# Patient Record
Sex: Female | Born: 1940 | Race: White | Hispanic: No | Marital: Single | Smoking: Former smoker
Health system: Southern US, Community
[De-identification: ages and names within clinical notes are randomized; demographics above are authoritative.]

## PROBLEM LIST (undated history)

## (undated) DIAGNOSIS — M1990 Unspecified osteoarthritis, unspecified site: Secondary | ICD-10-CM

## (undated) DIAGNOSIS — E039 Hypothyroidism, unspecified: Secondary | ICD-10-CM

## (undated) DIAGNOSIS — G40909 Epilepsy, unspecified, not intractable, without status epilepticus: Secondary | ICD-10-CM

## (undated) DIAGNOSIS — E119 Type 2 diabetes mellitus without complications: Secondary | ICD-10-CM

## (undated) DIAGNOSIS — F0390 Unspecified dementia without behavioral disturbance: Secondary | ICD-10-CM

## (undated) DIAGNOSIS — I499 Cardiac arrhythmia, unspecified: Secondary | ICD-10-CM

## (undated) DIAGNOSIS — IMO0001 Reserved for inherently not codable concepts without codable children: Secondary | ICD-10-CM

## (undated) DIAGNOSIS — Z972 Presence of dental prosthetic device (complete) (partial): Secondary | ICD-10-CM

## (undated) DIAGNOSIS — S4990XA Unspecified injury of shoulder and upper arm, unspecified arm, initial encounter: Secondary | ICD-10-CM

## (undated) DIAGNOSIS — K08109 Complete loss of teeth, unspecified cause, unspecified class: Secondary | ICD-10-CM

## (undated) DIAGNOSIS — J45909 Unspecified asthma, uncomplicated: Secondary | ICD-10-CM

## (undated) DIAGNOSIS — I10 Essential (primary) hypertension: Secondary | ICD-10-CM

## (undated) DIAGNOSIS — K219 Gastro-esophageal reflux disease without esophagitis: Secondary | ICD-10-CM

## (undated) DIAGNOSIS — G709 Myoneural disorder, unspecified: Secondary | ICD-10-CM

## (undated) DIAGNOSIS — J449 Chronic obstructive pulmonary disease, unspecified: Secondary | ICD-10-CM

## (undated) HISTORY — PX: APPENDECTOMY: SHX54

## (undated) HISTORY — PX: CHOLECYSTECTOMY: SHX55

## (undated) HISTORY — PX: ABDOMINAL HYSTERECTOMY: SHX81

---

## 2011-03-16 ENCOUNTER — Ambulatory Visit: Payer: Self-pay | Admitting: Unknown Physician Specialty

## 2011-03-20 LAB — PATHOLOGY REPORT

## 2014-08-27 LAB — COMPREHENSIVE METABOLIC PANEL
ALK PHOS: 119 U/L — AB
ANION GAP: 5 — AB (ref 7–16)
Albumin: 3.2 g/dL — ABNORMAL LOW (ref 3.4–5.0)
BUN: 6 mg/dL — ABNORMAL LOW (ref 7–18)
Bilirubin,Total: 0.3 mg/dL (ref 0.2–1.0)
CREATININE: 0.65 mg/dL (ref 0.60–1.30)
Calcium, Total: 8.6 mg/dL (ref 8.5–10.1)
Chloride: 98 mmol/L (ref 98–107)
Co2: 32 mmol/L (ref 21–32)
EGFR (Non-African Amer.): >60
Glucose: 89 mg/dL (ref 65–99)
OSMOLALITY: 267 (ref 275–301)
POTASSIUM: 4.3 mmol/L (ref 3.5–5.1)
SGOT(AST): 39 U/L — ABNORMAL HIGH (ref 15–37)
SGPT (ALT): 44 U/L
SODIUM: 135 mmol/L — AB (ref 136–145)
Total Protein: 7.2 g/dL (ref 6.4–8.2)

## 2014-08-27 LAB — CBC
HCT: 37.3 % (ref 35.0–47.0)
HGB: 12.2 g/dL (ref 12.0–16.0)
MCH: 31.2 pg (ref 26.0–34.0)
MCHC: 32.8 g/dL (ref 32.0–36.0)
MCV: 95 fL (ref 80–100)
Platelet: 288 10*3/uL (ref 150–440)
RBC: 3.92 10*6/uL (ref 3.80–5.20)
RDW: 15.8 % — ABNORMAL HIGH (ref 11.5–14.5)
WBC: 6.2 10*3/uL (ref 3.6–11.0)

## 2014-08-27 LAB — TROPONIN I: Troponin-I: <0.02 ng/mL

## 2014-08-28 ENCOUNTER — Observation Stay: Payer: Self-pay | Admitting: Internal Medicine

## 2014-08-28 DIAGNOSIS — I361 Nonrheumatic tricuspid (valve) insufficiency: Secondary | ICD-10-CM

## 2014-08-28 LAB — DIGOXIN LEVEL: Digoxin: 1.12 ng/mL

## 2014-08-28 LAB — HEMOGLOBIN A1C: Hemoglobin A1C: 10.1 % — ABNORMAL HIGH (ref 4.2–6.3)

## 2014-08-28 LAB — CK-MB
CK-MB: 1.4 ng/mL (ref 0.5–3.6)
CK-MB: 1.3 ng/mL (ref 0.5–3.6)
CK-MB: 1.4 ng/mL (ref 0.5–3.6)

## 2014-08-28 LAB — TROPONIN I: TROPONIN-I: 0.03 ng/mL

## 2014-08-29 LAB — CBC WITH DIFFERENTIAL/PLATELET
Basophil #: 0 10*3/uL (ref 0.0–0.1)
Basophil %: 0.2 %
EOS ABS: 0 10*3/uL (ref 0.0–0.7)
Eosinophil %: 0 %
HCT: 41 % (ref 35.0–47.0)
HGB: 13 g/dL (ref 12.0–16.0)
LYMPHS PCT: 10.6 %
Lymphocyte #: 0.6 10*3/uL — ABNORMAL LOW (ref 1.0–3.6)
MCH: 30.3 pg (ref 26.0–34.0)
MCHC: 31.7 g/dL — AB (ref 32.0–36.0)
MCV: 96 fL (ref 80–100)
MONO ABS: 0.1 x10 3/mm — AB (ref 0.2–0.9)
Monocyte %: 2.1 %
NEUTROS PCT: 87.1 %
Neutrophil #: 5.3 10*3/uL (ref 1.4–6.5)
Platelet: 299 10*3/uL (ref 150–440)
RBC: 4.28 10*6/uL (ref 3.80–5.20)
RDW: 16.1 % — ABNORMAL HIGH (ref 11.5–14.5)
WBC: 6 10*3/uL (ref 3.6–11.0)

## 2014-08-29 LAB — BASIC METABOLIC PANEL
Anion Gap: 8 (ref 7–16)
BUN: 8 mg/dL (ref 7–18)
Calcium, Total: 9 mg/dL (ref 8.5–10.1)
Chloride: 95 mmol/L — ABNORMAL LOW (ref 98–107)
Co2: 30 mmol/L (ref 21–32)
Creatinine: 0.64 mg/dL (ref 0.60–1.30)
EGFR (African American): >60
Glucose: 271 mg/dL — ABNORMAL HIGH (ref 65–99)
OSMOLALITY: 274 (ref 275–301)
Potassium: 4.6 mmol/L (ref 3.5–5.1)
Sodium: 133 mmol/L — ABNORMAL LOW (ref 136–145)

## 2014-09-03 ENCOUNTER — Ambulatory Visit: Payer: Self-pay | Admitting: Hospice and Palliative Medicine

## 2014-09-09 ENCOUNTER — Inpatient Hospital Stay: Payer: Self-pay | Admitting: Internal Medicine

## 2014-09-09 LAB — COMPREHENSIVE METABOLIC PANEL
ANION GAP: 7 (ref 7–16)
AST: 112 U/L — AB (ref 15–37)
Albumin: 3.3 g/dL — ABNORMAL LOW (ref 3.4–5.0)
Alkaline Phosphatase: 140 U/L — ABNORMAL HIGH
BUN: 10 mg/dL (ref 7–18)
Bilirubin,Total: 0.5 mg/dL (ref 0.2–1.0)
CALCIUM: 8.2 mg/dL — AB (ref 8.5–10.1)
CHLORIDE: 91 mmol/L — AB (ref 98–107)
Co2: 27 mmol/L (ref 21–32)
Creatinine: 0.73 mg/dL (ref 0.60–1.30)
EGFR (Non-African Amer.): >60
GLUCOSE: 199 mg/dL — AB (ref 65–99)
OSMOLALITY: 256 (ref 275–301)
POTASSIUM: 5 mmol/L (ref 3.5–5.1)
SGPT (ALT): 93 U/L — ABNORMAL HIGH
SODIUM: 125 mmol/L — AB (ref 136–145)
TOTAL PROTEIN: 8.1 g/dL (ref 6.4–8.2)

## 2014-09-09 LAB — BASIC METABOLIC PANEL
Anion Gap: 6 — ABNORMAL LOW (ref 7–16)
Anion Gap: 8 (ref 7–16)
BUN: 9 mg/dL (ref 7–18)
BUN: 9 mg/dL (ref 7–18)
CALCIUM: 8.3 mg/dL — AB (ref 8.5–10.1)
CALCIUM: 8.3 mg/dL — AB (ref 8.5–10.1)
CREATININE: 0.68 mg/dL (ref 0.60–1.30)
Chloride: 92 mmol/L — ABNORMAL LOW (ref 98–107)
Chloride: 94 mmol/L — ABNORMAL LOW (ref 98–107)
Co2: 33 mmol/L — ABNORMAL HIGH (ref 21–32)
Co2: 30 mmol/L (ref 21–32)
Creatinine: 0.72 mg/dL (ref 0.60–1.30)
GLUCOSE: 200 mg/dL — AB (ref 65–99)
Glucose: 181 mg/dL — ABNORMAL HIGH (ref 65–99)
OSMOLALITY: 266 (ref 275–301)
OSMOLALITY: 269 (ref 275–301)
POTASSIUM: 4.3 mmol/L (ref 3.5–5.1)
Potassium: 4.3 mmol/L (ref 3.5–5.1)
SODIUM: 131 mmol/L — AB (ref 136–145)
Sodium: 132 mmol/L — ABNORMAL LOW (ref 136–145)

## 2014-09-09 LAB — TROPONIN I
TROPONIN-I: 0.06 ng/mL — AB
TROPONIN-I: 0.07 ng/mL — AB
Troponin-I: 0.07 ng/mL — ABNORMAL HIGH

## 2014-09-09 LAB — CBC
HCT: 42.7 % (ref 35.0–47.0)
HGB: 13.6 g/dL (ref 12.0–16.0)
MCH: 30.6 pg (ref 26.0–34.0)
MCHC: 31.9 g/dL — ABNORMAL LOW (ref 32.0–36.0)
MCV: 96 fL (ref 80–100)
Platelet: 249 10*3/uL (ref 150–440)
RBC: 4.45 10*6/uL (ref 3.80–5.20)
RDW: 15.9 % — ABNORMAL HIGH (ref 11.5–14.5)
WBC: 8.5 10*3/uL (ref 3.6–11.0)

## 2014-09-09 LAB — CK TOTAL AND CKMB (NOT AT ARMC)
CK, TOTAL: 155 U/L (ref 26–192)
CK, Total: 136 U/L (ref 26–192)
CK-MB: 2.4 ng/mL (ref 0.5–3.6)
CK-MB: 2.4 ng/mL (ref 0.5–3.6)

## 2014-09-09 LAB — SODIUM, URINE, RANDOM: SODIUM, URINE RANDOM: 66 mmol/L (ref 20–110)

## 2014-09-09 LAB — OSMOLALITY, URINE: OSMOLALITY: 403 mosm/kg

## 2014-09-09 LAB — PRO B NATRIURETIC PEPTIDE: B-Type Natriuretic Peptide: 2322 pg/mL — ABNORMAL HIGH (ref 0–125)

## 2014-09-10 LAB — CBC WITH DIFFERENTIAL/PLATELET
BASOS ABS: 0 10*3/uL (ref 0.0–0.1)
BASOS PCT: 0.1 %
Eosinophil #: 0 10*3/uL (ref 0.0–0.7)
Eosinophil %: 0.1 %
HCT: 35.9 % (ref 35.0–47.0)
HGB: 11.6 g/dL — AB (ref 12.0–16.0)
LYMPHS PCT: 11.5 %
Lymphocyte #: 0.5 10*3/uL — ABNORMAL LOW (ref 1.0–3.6)
MCH: 31 pg (ref 26.0–34.0)
MCHC: 32.4 g/dL (ref 32.0–36.0)
MCV: 96 fL (ref 80–100)
MONOS PCT: 5.7 %
Monocyte #: 0.2 x10 3/mm (ref 0.2–0.9)
Neutrophil #: 3.6 10*3/uL (ref 1.4–6.5)
Neutrophil %: 82.6 %
Platelet: 216 10*3/uL (ref 150–440)
RBC: 3.76 10*6/uL — ABNORMAL LOW (ref 3.80–5.20)
RDW: 15.6 % — ABNORMAL HIGH (ref 11.5–14.5)
WBC: 4.4 10*3/uL (ref 3.6–11.0)

## 2014-09-10 LAB — PHENOBARBITAL LEVEL: PHENOBARBITAL: 40 ug/mL (ref 15.0–40.0)

## 2014-09-10 LAB — COMPREHENSIVE METABOLIC PANEL
ALK PHOS: 108 U/L
ALT: 68 U/L — AB
AST: 60 U/L — AB (ref 15–37)
Albumin: 2.5 g/dL — ABNORMAL LOW (ref 3.4–5.0)
Anion Gap: 9 (ref 7–16)
BUN: 11 mg/dL (ref 7–18)
Bilirubin,Total: 0.3 mg/dL (ref 0.2–1.0)
CALCIUM: 7.9 mg/dL — AB (ref 8.5–10.1)
CHLORIDE: 94 mmol/L — AB (ref 98–107)
CO2: 28 mmol/L (ref 21–32)
Creatinine: 0.84 mg/dL (ref 0.60–1.30)
EGFR (Non-African Amer.): >60
Glucose: 262 mg/dL — ABNORMAL HIGH (ref 65–99)
Osmolality: 271 (ref 275–301)
Potassium: 4.6 mmol/L (ref 3.5–5.1)
Sodium: 131 mmol/L — ABNORMAL LOW (ref 136–145)
Total Protein: 6.4 g/dL (ref 6.4–8.2)

## 2014-09-10 LAB — LIPID PANEL
Cholesterol: 116 mg/dL (ref 0–200)
HDL Cholesterol: 68 mg/dL — ABNORMAL HIGH (ref 40–60)
Ldl Cholesterol, Calc: 34 mg/dL (ref 0–100)
TRIGLYCERIDES: 70 mg/dL (ref 0–200)
VLDL Cholesterol, Calc: 14 mg/dL (ref 5–40)

## 2014-09-10 LAB — PHOSPHORUS: PHOSPHORUS: 3.2 mg/dL (ref 2.5–4.9)

## 2014-09-10 LAB — DIGOXIN LEVEL: Digoxin: 1.59 ng/mL

## 2014-09-10 LAB — MAGNESIUM: MAGNESIUM: 1.6 mg/dL — AB

## 2014-09-10 LAB — TSH: Thyroid Stimulating Horm: 1.06 u[IU]/mL

## 2014-09-10 LAB — HEMOGLOBIN A1C: Hemoglobin A1C: 9.2 % — ABNORMAL HIGH (ref 4.2–6.3)

## 2014-09-11 LAB — CBC WITH DIFFERENTIAL/PLATELET
BASOS ABS: 0 10*3/uL (ref 0.0–0.1)
Basophil %: 0.1 %
EOS ABS: 0 10*3/uL (ref 0.0–0.7)
EOS PCT: 0 %
HCT: 34.9 % — ABNORMAL LOW (ref 35.0–47.0)
HGB: 11.5 g/dL — AB (ref 12.0–16.0)
LYMPHS PCT: 11 %
Lymphocyte #: 0.7 10*3/uL — ABNORMAL LOW (ref 1.0–3.6)
MCH: 30.6 pg (ref 26.0–34.0)
MCHC: 32.8 g/dL (ref 32.0–36.0)
MCV: 93 fL (ref 80–100)
Monocyte #: 0.5 x10 3/mm (ref 0.2–0.9)
Monocyte %: 7.7 %
NEUTROS PCT: 81.2 %
Neutrophil #: 5 10*3/uL (ref 1.4–6.5)
Platelet: 250 10*3/uL (ref 150–440)
RBC: 3.75 10*6/uL — AB (ref 3.80–5.20)
RDW: 15.4 % — ABNORMAL HIGH (ref 11.5–14.5)
WBC: 6.1 10*3/uL (ref 3.6–11.0)

## 2014-09-11 LAB — MAGNESIUM: Magnesium: 1.8 mg/dL

## 2014-09-11 LAB — BASIC METABOLIC PANEL
ANION GAP: 1 — AB (ref 7–16)
BUN: 14 mg/dL (ref 7–18)
CREATININE: 0.79 mg/dL (ref 0.60–1.30)
Calcium, Total: 8.4 mg/dL — ABNORMAL LOW (ref 8.5–10.1)
Chloride: 98 mmol/L (ref 98–107)
Co2: 29 mmol/L (ref 21–32)
EGFR (African American): >60
Glucose: 337 mg/dL — ABNORMAL HIGH (ref 65–99)
Osmolality: 271 (ref 275–301)
Potassium: 4.6 mmol/L (ref 3.5–5.1)
SODIUM: 128 mmol/L — AB (ref 136–145)

## 2014-09-11 LAB — URINE CULTURE

## 2014-09-12 LAB — BASIC METABOLIC PANEL
ANION GAP: 6 — AB (ref 7–16)
BUN: 16 mg/dL (ref 7–18)
CO2: 31 mmol/L (ref 21–32)
Calcium, Total: 7.9 mg/dL — ABNORMAL LOW (ref 8.5–10.1)
Chloride: 94 mmol/L — ABNORMAL LOW (ref 98–107)
Creatinine: 0.8 mg/dL (ref 0.60–1.30)
EGFR (African American): >60
EGFR (Non-African Amer.): >60
Glucose: 304 mg/dL — ABNORMAL HIGH (ref 65–99)
OSMOLALITY: 275 (ref 275–301)
Potassium: 4.1 mmol/L (ref 3.5–5.1)
Sodium: 131 mmol/L — ABNORMAL LOW (ref 136–145)

## 2014-09-13 LAB — BASIC METABOLIC PANEL
ANION GAP: 2 — AB (ref 7–16)
BUN: 10 mg/dL (ref 7–18)
CALCIUM: 8.5 mg/dL (ref 8.5–10.1)
Chloride: 93 mmol/L — ABNORMAL LOW (ref 98–107)
Co2: 39 mmol/L — ABNORMAL HIGH (ref 21–32)
Creatinine: 0.65 mg/dL (ref 0.60–1.30)
EGFR (African American): >60
GLUCOSE: 237 mg/dL — AB (ref 65–99)
Osmolality: 275 (ref 275–301)
Potassium: 4 mmol/L (ref 3.5–5.1)
SODIUM: 134 mmol/L — AB (ref 136–145)

## 2014-09-14 LAB — CULTURE, BLOOD (SINGLE)

## 2014-09-16 DIAGNOSIS — J440 Chronic obstructive pulmonary disease with acute lower respiratory infection: Secondary | ICD-10-CM

## 2014-09-16 DIAGNOSIS — G3184 Mild cognitive impairment, so stated: Secondary | ICD-10-CM

## 2014-09-16 DIAGNOSIS — I480 Paroxysmal atrial fibrillation: Secondary | ICD-10-CM

## 2014-09-16 DIAGNOSIS — E119 Type 2 diabetes mellitus without complications: Secondary | ICD-10-CM

## 2014-09-16 DIAGNOSIS — J189 Pneumonia, unspecified organism: Secondary | ICD-10-CM

## 2014-10-04 ENCOUNTER — Ambulatory Visit: Payer: Self-pay | Admitting: Hospice and Palliative Medicine

## 2014-10-04 DIAGNOSIS — J189 Pneumonia, unspecified organism: Secondary | ICD-10-CM

## 2014-10-04 DIAGNOSIS — E162 Hypoglycemia, unspecified: Secondary | ICD-10-CM

## 2014-10-04 DIAGNOSIS — J90 Pleural effusion, not elsewhere classified: Secondary | ICD-10-CM

## 2014-10-04 DIAGNOSIS — I480 Paroxysmal atrial fibrillation: Secondary | ICD-10-CM

## 2014-10-04 DIAGNOSIS — J449 Chronic obstructive pulmonary disease, unspecified: Secondary | ICD-10-CM

## 2014-11-20 ENCOUNTER — Emergency Department: Payer: Self-pay | Admitting: Emergency Medicine

## 2014-12-25 NOTE — H&P (Signed)
PATIENT NAME:  Morgan Meyer, Morgan Meyer MR#:  [id number] DATE OF BIRTH:  Jun 30, 1941  DATE OF ADMISSION:  08/28/2014  PRIMARY CARE PHYSICIAN: Nonlocal.  REFERRING PHYSICIAN: Larae Grooms, MD  CHIEF COMPLAINT: Syncope, sluggishness.  HISTORY OF PRESENT ILLNESS: Morgan Meyer is a 74 year old female with a history of poorly controlled diabetes mellitus, hypertension, hyperlipidemia; has been having poorly controlled blood sugars and required admission during the Thanksgiving time for the blood sugars over 500 associated with a diabetic ketoacidosis. Since then, patient has been living with patient's son. Patient continues to have high blood sugars; has polyuria, polydipsia. Last hemoglobin A1c was 11.5. Today patient went to toilet, where patient had an episode of presyncope, slumped over for about 30 seconds. During that time, patient did not lose complete loss of consciousness. However, patient remained somewhat confused. Concerning this, patient's son checked the blood sugars, which were noted to be around 450. Patient's son gave Humalog of 20 units and brought the patient to the Emergency Department after discussing with her primary care physician. In the Emergency Department, patient was noted to have blood sugars of 90. Concerning about patient's confusion, there was concern about cardiac-related issues. The Emergency Department physician requested medicine consult. Patient at baseline walks around in the house without any distress, usually well conscious, oriented. Patient is currently quite somnolent, arousable. Mainly, history is obtained from the patient's son who is at bedside and who is primary caregiver for the patient. Patient denied having any chest pain.  PAST MEDICAL HISTORY: 1.  Hypertension.  2.  Hyperlipidemia. 3.  Diabetes mellitus, insulin dependent, poorly controlled, with the last hemoglobin A1c of 11.5. 4.  Atrial fibrillation on chronic anticoagulation with Eliquis. 5.  History of seizure  disorder. 6.  Hypothyroidism. 7.  Chronic low back pain.  ALLERGIES: No known drug allergies.  HOME MEDICATIONS: Reconciliation has not been done yet.  SOCIAL HISTORY: Patient has previous history of smoking, quit many years back. Denies drinking alcohol or using illicit drugs. Currently lives with her son.  FAMILY HISTORY: Diabetes mellitus.  REVIEW OF SYSTEMS: CONSTITUTIONAL: Experiencing generalized weakness. EYES: No change in vision. EARS, NOSE, AND THROAT: No change in hearing. RESPIRATORY: No cough, shortness of breath. CARDIOVASCULAR: No chest pain, palpitations. GASTROINTESTINAL: No nausea, vomiting, abdominal pain. GENITOURINARY: No dysuria or hematuria. HEMATOLOGIC: No easy bruising or bleeding. SKIN: No rash or lesions. MUSCULOSKELETAL: No joint pains. NEUROLOGIC: No weakness or numbness in any part of the body.  PHYSICAL EXAMINATION: GENERAL: This is a well-built, well-nourished, age-appropriate female lying down in the bed, somnolent; however, arousable. VITAL SIGNS: Temperature 98.4, pulse 69, blood pressure 167/73, respiratory rate of 18, oxygen saturation 95% on room air. HEENT: Head normocephalic, atraumatic. There is no scleral icterus. Conjunctivae normal. Pupils equal and reactive. Mucous membranes moist. No pharyngeal erythema. NECK: Supple. No lymphadenopathy. No JVD. No carotid bruit. CHEST: No focal tenderness. LUNGS: Bilaterally clear to auscultation. HEART: S1, S2 regular. No murmurs are heard. ABDOMEN: Bowel sounds plus. Soft, nontender, nondistended. No hepatosplenomegaly. EXTREMITIES: 1 to 2+ pitting edema extending up to the knees. NEUROLOGIC: Patient is somnolent; however, arousable. Oriented to place, person, and time. Cranial nerves II-XII contact. Motor 5/5 in upper and lower extremities.   LABORATORY: CBC, CMP are completely within normal limits. Troponins are negative.  IMAGING: Chest x-ray, 1 view, portable: No acute cardiopulmonary  disease. Mild interstitial edema with basilar atelectasis.  EKG: 12-lead: Atrial fibrillation with a rate of 80.  ASSESSMENT AND PLAN: 1.  Presyncope. The patient has dry mucous membranes,  has hemoglobin A1c of 11.5. Highly concerning about orthostatic hypotension, especially after the bowel movement. Defecation-induced vasovagal is another possibility. Check the orthostatic blood pressure. Concerning about the dry mucous membranes. Will give intravenous fluids. Will continue to check the cardiac enzymes; however, seems to be unlikely. Patient's heart rate is well controlled. 2.  Diabetes mellitus, poorly controlled. Will continue the current regimen and follow up. 3.  Atrial fibrillation that is well controlled. Continue with Cardizem and Eliquis. 4.  Debility. Will involve physical therapy. 5.  Patient is already on Eliquis, which should provide the deep venous thrombosis prophylaxis.  TIME SPENT: 55 minutes.   ____________________________ Monica Becton, MD pv:ST D: 08/28/2014 01:34:37 ET T: 08/28/2014 02:31:21 ET JOB#: [id number]  cc: Monica Becton, MD, <Dictator> Monica Becton MD ELECTRONICALLY SIGNED 08/30/2014 22:11

## 2014-12-29 NOTE — Discharge Summary (Signed)
PATIENT NAME:  Morgan Meyer, Morgan Meyer MR#:  [id number] DATE OF BIRTH:  [birth year]  DATE OF ADMISSION:  08/28/2014 DATE OF DISCHARGE:    ADMITTING PHYSICIAN: Monica Becton, MD.  DISCHARGING PHYSICIAN: Gladstone Lighter, MD.  PRIMARY CARE PHYSICIAN: Nonlocal in [place].  [place]: None.   DISCHARGE DIAGNOSES:  1. Syncope, likely vasovagal.  2. Uncontrolled diabetes mellitus.  3. Atrial fibrillation on Eliquis.  4. Asthma exacerbation.  5. Seizure disorder.  6. Chronic low back pain.  7. Hypertension.  8. Hypothyroidism.  DISCHARGE HOME MEDICATIONS:  1. Albuterol nebulizer 3 mL q. 6 hours p.r.n. for wheezing for chronic obstructive pulmonary disease.  2. Ferrous sulfate 325 mg p.o. b.i.d.  3. Digoxin 250 mcg p.o. daily.  4. Eliquis 5 mg p.o. b.i.d.  5. Synthroid 75 mcg p.o. daily.  6. Cardizem 120 mg p.o. daily.  7. Advair 250/50 mcg 1 puff b.i.d.  8. Metoprolol 50 mg p.o. b.i.d.  9. Lisinopril/hydrochlorothiazide 20/12.5 mg p.o. daily.  10. Atorvastatin 20 mg p.o. daily.  11. Metformin 1000 mg p.o. b.i.d.  12. Phenobarbital 64.8 mg p.o. b.i.d.  13. Fish oil 1200-mg capsule p.o. b.i.d.  14. Albuterol inhaler 2 puffs q .6 hours p.r.n. for wheezing.  15. Levemir 60 units once a day at bedtime.  16. NovoLog FlexPen 15 units subcutaneously 3 times a day with meals.  17. Prednisone taper over 5 days.  18. Tylenol 650 mg q. 6 hours p.r.n. for pain or fever.  DISCHARGE HOME OXYGEN: None.   DISCHARGE DIET: Low-sodium, ADA, 1800-calorie diet.   DISCHARGE ACTIVITY: As tolerated.  FOLLOWUP INSTRUCTIONS:  1. PCP followup in 1 to 2 weeks. Home health physical therapy and nursing.  2. Cardiology followup with [facility] in 2 weeks.  LABORATORIES AND IMAGING STUDIES PRIOR TO DISCHARGE: WBC 6, hemoglobin 13.3, hematocrit 41, platelet count 299,000.   Sodium 133, potassium 4.6, chloride 95, bicarbonate 30, BUN 8, creatinine 0.64, glucose of 271, calcium of 9.  Ultrasound Dopplers bilaterally showing no evidence of hemodynamically significant stenosis. Echo is pending at this time. HbA1c is elevated at 10.1.  Chest x-ray on admission showing mild interstitial edema, bibasilar atelectasis, otherwise clear lung fields.   ALT 44, AST 39, alkaline phosphatase 119, total bilirubin 0.3 and albumin of 3.2. Troponins are negative in the hospital course.  BRIEF HOSPITAL COURSE: Ms. Tacker is a 74 year old pleasant Caucasian female who is originally from [place], has multiple medical problems including diabetes mellitus, hypertension, atrial fibrillation, on Eliquis, seizure disorder, chronic low back pain, who is living with her son for the past month almost. She presents to the hospital after she had a near syncopal episode at home. 1. Near syncope, likely from dehydration and also vasovagal as happened after a bowel movement. Her sugars were noted to be uncontrolled in the hospital. She was monitored on telemetry, remained in atrial fibrillation, rate controlled and has had Dopplers of the carotids which did not show any hemodynamically significant stenosis. Echocardiogram is done; however, the final report is pending and will not be back until tomorrow. The patient worked with physical therapy. Symptoms have significantly improved, and she is being discharged home with home health at this time. She already has a walker at home.  2. Uncontrolled diabetes mellitus. HbA1c of 10, on Levemir at home. Increased to 60 units now and also on NovoLog t.i.d. with meals and also metformin which will be continued. Will need close outpatient monitoring. 3. Asthma exacerbation here in the hospital. Started on IV steroids and  nebulizers with significant improvement, and is being discharged on a prednisone taper. She is already on a Proventil inhaler, Advair and albuterol nebulizer as needed.  4. Atrial fibrillation, rate controlled on digoxin and Cardizem p.o. and also on  Eliquis. She is also taking metoprolol at home. No issues on the monitor. Recommended outpatient cardiology followup.  5. Seizure disorder. Her phenobarbital has been continued. 6. Her course has been otherwise uneventful in the hospital.   DISCHARGE CONDITION: Stable.   DISCHARGE DISPOSITION: Home with home health.  TIME SPENT ON DISCHARGE: 45 minutes.    ____________________________ Gladstone Lighter, MD rk:jh D: 08/29/2014 12:31:17 ET T: 08/29/2014 13:55:51 ET JOB#: [id number]  cc: Gladstone Lighter, MD, <Dictator> Gladstone Lighter MD ELECTRONICALLY SIGNED 09/07/2014 18:42

## 2015-01-02 NOTE — Consult Note (Signed)
PATIENT NAME:  Morgan Meyer, SADIK MR#:  [id number] DATE OF BIRTH:  Dec 11, 1940  DATE OF CONSULTATION:  09/10/2014  REFERRING PHYSICIAN:   CONSULTING PHYSICIAN:  Dionisio David, MD  INDICATION FOR CONSULTATION: Atrial fibrillation, CHF.    HISTORY OF PRESENT ILLNESS: This is a 74 year old white female who is actually from [place] and had been visiting her son in [place], presented to the Emergency Room with altered mental status, a fall, and shortness of breath, her blood sugar was 42, her sodium was 125 and she came in. I was asked to evaluate the patient because of atrial fibrillation.   PAST MEDICAL HISTORY: A history of COPD, hypertension, hyperlipidemia, insulin requiring diabetes mellitus with poorly controlled diabetes, hemoglobin A1c 11.7, atrial fibrillation on Eliquis, seizure disorder, hypothyroidism, chronic low back pain.   PAST MEDICAL HISTORY: The patient does have the ICD.   ALLERGIES: None.   SOCIAL HISTORY: She lives alone. Her son brought her here to [place] because she keeps getting sick.   PHYSICAL EXAMINATION: GENERAL: She is alert and oriented. She is wearing CPAP. Monitor shows atrial fibrillation with ventricular response rate of 90. Blood pressure 146/78, respirations 19, saturation is 98.  NECK: Positive JVD.  LUNGS: There are a few crackles at the bases.  HEART: Irregularly irregular pulse. Normal S1, S2. No audible murmur.  ABDOMEN: Soft, nontender, positive bowel sounds.  EXTREMITIES: No pedal edema.  NEUROLOGIC: She appears to be intact.   LABORATORY DATA: EKG shows a normal sinus rhythm, 98 beats per minute, incomplete right bundle branch block, anteroseptal wall MI, questionable age, nonspecific ST-T changes. BUN 9, creatinine 0.68. Troponin is mildly elevated at 0.07. Glucose is 200.   ASSESSMENT AND PLAN: The patient has atrial fibrillation with controlled ventricular response rate, currently on digoxin and Cardizem with ventricular rate being  okay. Apparently, she has chronic atrial fibrillation, has been on Eliquis, now presented more like questionable pneumonia and respiratory failure. She is on antibiotics. Advised continuation of current medications for atrial fibrillation. There is mildly elevated troponin, probably has coronary artery disease. She had an echocardiogram done which was read and is in the chart.     ____________________________ Dionisio David, MD sak:at D: 09/10/2014 09:22:36 ET T: 09/10/2014 09:45:42 ET JOB#: [id number]  cc: Dionisio David, MD, <Dictator> Dionisio David MD ELECTRONICALLY SIGNED 09/14/2014 15:38

## 2015-01-02 NOTE — H&P (Signed)
PATIENT NAME:  Morgan Meyer, Morgan Meyer MR#:  [id number] DATE OF BIRTH:  1940-09-11  DATE OF ADMISSION:  09/09/2014  PRIMARY CARE PHYSICIAN: Dr. Jerrye Noble in [place], that is nonlocal.   REFERRING PHYSICIAN:  Dr.Kinner  CHIEF COMPLAINT: Altered mental status, fall, and shortness of breath.   HISTORY OF PRESENT ILLNESS: The patient is a 74 year old Caucasian female who lives in [place], is brought into [place] by her son as the patient has been frequently becoming sick. Today she is brought into the ED after she sustained a fall associated with shortness of breath, cough, and lethargy. The patient being lethargic I was unable to get any history from her. Initially in the ED the patient was hypoxic at 84-85% on room air. As the patient is short of breath, coughing, and hypoxic she was placed on BiPAP machine. Her initial sodium is 125. Troponin is slightly elevated at 0.06. The patient has chronic history of atrial fibrillation currently rate controlled at 94 beats per minute. Son is reporting that she is frequently falling and she was found to be hypoglycemic on Tuesday with a blood sugar of 42. He is so worried that she is falling very often especially at around 4:00 when she goes to the bathroom. He is not quite sure whether she is totally losing consciousness or not. Today also she fell and complaining of mid back pain prior to her ER arrival.  Recently her Lantus was changed from 60 units to 50 units by primary care physician. Yesterday she was followed up by primary care physician in [place], Dr. Jerrye Noble, who diagnosed her with pneumonia and started her on levofloxacin. Here in the ED at [facility] her chest x-ray reveals bronchitis, but no infiltrates. No fluid overload either. The patient was given Lasix IV in [facility] ED and hospitalist team is called to admit the patient. The patient is being very lethargic, I was unable to get any history from her. The patient opens her  eyes to verbal commands, but falling asleep. Currently she is on BiPAP. Son is at bedside.  History is taken from her son.   PAST MEDICAL HISTORY: History of COPD, hypertension, hyperlipidemia, insulin-requiring diabetes mellitus poorly controlled with last hemoglobin A1c at 11.7 according to the previous records, chronic atrial fibrillation on Eliquis, history of seizure disorders, hypothyroidism, chronic low back pain.   PAST SURGICAL HISTORY: AICD.    ALLERGIES: No known drug allergies.   PSYCHOSOCIAL HISTORY: She usually lives alone at [place], but son brought her in to live with him in [place] as the patient is frequently becoming sick and falling often. Currently she lives in [place] with son. The patient smoked for several years and quit a few years ago. Has chronic history of COPD. No history of alcohol or illicit drug usage.   FAMILY HISTORY: Diabetes mellitus runs in her family.   REVIEW OF SYSTEMS: Unobtainable.   PHYSICAL EXAMINATION:  VITAL SIGNS: Temperature 99.1, pulse 87, respirations 18, blood pressure 160/56, pulse oximetry is 98% on BiPAP, initially she was at 89% on room air.  GENERAL APPEARANCE: Not in acute distress, currently on BiPAP, very lethargic, sick-looking.  HEENT: Normocephalic and atraumatic. Pupils are slowly reacting, but equally reacting to light and accommodation. No scleral icterus.  NECK: Supple. No JVD. No thyromegaly.  LUNGS: Coarse bronchial breath sounds with diffuse wheezing. No rhonchi. No crackles. CARDIOVASCULAR: Irregularly irregular. No murmurs.  GASTROINTESTINAL: Soft, obese. Bowel sounds are positive in all 4 quadrants. Nontender, nondistended. No hepatosplenomegaly.  No masses felt.  NEUROLOGIC: Very lethargic, arousable to verbal commands, but falling asleep, not answering any questions, on BiPAP. Could not elicit motor and sensory.  PSYCHIATRIC: Mood and affect could not be elicited as the patient is lethargic.   LABORATORY  DATA:  CBC: WBC normal, hemoglobin and hematocrit are normal, platelet count of 249,000. PH 7.360, pCO2 of 54, pO2 of 90, SaO2 of 35%, bicarbonate is 30.5. LFTs, total protein 9.1, albumin 3.3, bilirubin total is normal, alkaline phosphatase is 140, AST 112, ALT is 93. Troponin 0.06. Natriuretic peptide 2322. Glucose 199, BUN and creatinine are normal, sodium 125, the potassium normal at 5.0, chloride 91, CO2 normal. GFR greater than 60. Anion gap is 7. Serum osmolality 256. Calcium is 8.2. Portable chest x-ray, mild changes of chronic bronchitis or asthma, no acute cardiopulmonary disease, interval resolution of interstitial pulmonary edema since 08/27/2014.  A 12 lead EKG, atrial fibrillation at heart rate 94 beats per minute, incomplete right bundle branch block. CT head is ordered which is pending.   ASSESSMENT AND PLAN:  A 74 year old female brought into the Emergency Department via EMS with lethargy and fall today. Son is concerned regarding the patient's frequent falls which are happening at 4:00 to 5:00 a.m. in the morning when she is in the bathroom. The patient is also short of breath, hypoxic, coming with cough, very lethargic, placed on BiPAP. Seen by primary care physician at [place] yesterday, diagnosed with pneumonia. started on levofloxacin.   1.  Altered mental status, probably from acute hypoxic and hypercarbic respiratory failure and hyponatremia. We will admit her to CCU stepdown. Currently the patient is on BiPAP, we will continue that and slowly wean it off. We will provide her steroids and nebulizer treatment.  2.  Acute hypoxic hypercarbic respiratory failure secondary to acute exacerbation of chronic obstructive pulmonary disease with underlying acute bronchitis. IV Solu-Medrol 125 mg was given in the ED. We will continue Solu-Medrol 60 mg IV q. 6 hours, DuoNeb treatments q. 4 hours, and albuterol nebulizer treatments q. 4 hours p.r.n. Continue BiPAP and wean it off to room air. The  patient will be on IV levofloxacin for acute bronchitis, which was just started by her primary care physician yesterday. No signs of pneumonia or infection.  3.  Acute hyponatremia with altered mental status. Sodium is at 125. CT head is ordered which is pending. We will get a random urine sodium and urine osmolality. We will also check TSH and lipid panel. We will get serial BMPs. The patient seemed to be dehydrated for the past as she has been sick since Christmas and has been not drinking enough. We will provide her IV fluids. We will correct sodium at a slower rate to prevent central pontine myelonecrosis 4.  Recurrent syncope versus near syncope.  This is happening only at around 4:00 in the morning while the patient is on commode.  This sounds vasovagal to me, but the patient has history of seizures and atrial fibrillation.  These falls are unwitnessed. Son is not quite sure whether she is becoming unconscious or not. The patient lives alone in [place], he just brought her into [place] yesterday to live with him.  This needs further investigation. We will order echocardiogram, CAT scan of the head is ordered. We will get orthostatic blood pressures. We will get EEG. Cardiology consult is placed as well as neurology consult is placed. Also physical therapy consult is placed for gait evaluation.  5.  Elevated troponin.  This  is slightly elevated,  we will cycle cardiac biomarkers. This is probably from acute respiratory distress which could cause demand ischemia.  6.  Chronic atrial fibrillation, rate controlled. The patient is on Eliquis, will continue that and resume Cardizem.  7.  Insulin-requiring diabetes mellitus, frequent episodes of hypoglycemia recently. Currently the patient is lethargic and on BiPAP machine. I will just give her 15 units of Lantus for basal coverage and the patient will be on sliding scale insulin at this time.  8.  Hypertension. The patient is on Cardizem, we will  continue that and will provide her IV antihypertensive as needed basis.  9.  History of coronary artery disease with positive troponin. We will cycle cardiac biomarkers. The patient being lethargic I am not sure whether she has any chest pain or not. Cardiology consult is placed.   CODE STATUS: Limited code. Okay to do CPR and shock her, but son is reporting that she does not want intubation.  All her children are medical power of attorney. Plan of care was discussed in detail with the patient's son at bedside, his name is Cheyeanne Roadcap, contact number [phone].    TOTAL CRITICAL CARE TIME SPENT: 60 minutes.    ____________________________ Nicholes Mango, MD ag:bu D: 09/09/2014 17:21:18 ET T: 09/09/2014 18:24:56 ET JOB#: [id number]  cc: Nicholes Mango, MD, <Dictator> Nicholes Mango MD ELECTRONICALLY SIGNED 09/18/2014 15:25

## 2015-01-02 NOTE — Consult Note (Signed)
Referring Physician:  Nicholes Mango :   Primary Care Physician:  Nicholes Mango : [place], [address] [place], [place] [address], [place] [phone]  Reason for Consult: Admit Date: 09-Sep-2014  Chief Complaint: recurrent syncope  Reason for Consult: syncope   History of Present Illness: History of Present Illness:   74 yo RHD F presents to [facility] secondary to recurrent syncope.  Pt just moved here from [place] to live with son where they have noted declining health.  She has had several episodes of passing out in which all of them are in the bathroom.  There are no convulsions, no injuries, no tongue biting and no confusion after per pt.  Unfortunately, none of these have been witnessed either.  Pt can not feel them coming on and denies chest pain and palpations.  ROS:  General fatigue   HEENT no complaints   Lungs cough   Cardiac no complaints   GI no complaints   GU no complaints   Musculoskeletal no complaints   Extremities no complaints   Skin no complaints   Neuro no complaints   Endocrine no complaints   Psych no complaints   Past Medical/Surgical Hx:  Fall History:   Hypothyroidism:   COPD:   Back Pain, Chronic:   seizures:   Syncope:   Atrial Fibrillationo:   Type II Diabetes:   Past Medical/ Surgical Hx:  Past Medical History reviewed by me as above   Past Surgical History reviewed by me as above   Home Medications: Medication Instructions Last Modified Date/Time  levofloxacin 750 mg oral tablet 1 tab(s) orally once a day 07-Jan-16 15:55  phenobarbital 64.8 mg oral tablet 1 tab(s) orally 2 times a day 07-Jan-16 15:55  levothyroxine 75 mcg (0.075 mg) oral tablet 1 tab(s) orally once a day 07-Jan-16 15:55  Fish Oil 1200 mg oral capsule 1 cap(s) orally 2 times a day 07-Jan-16 15:55  diltiazem 120 mg/24 hours oral capsule, extended release 1 cap(s) orally once a day 07-Jan-16 15:55  atorvastatin 20 mg oral tablet 1 tab(s)  orally once a day 07-Jan-16 15:55  NovoLOG FlexPen 15 unit(s) subcutaneous 3 times a day (with meals) 07-Jan-16 15:55  metFORMIN 1000 mg oral tablet 1 tab(s) orally 2 times a day 07-Jan-16 15:55  insulin detemir 100 units/mL subcutaneous solution 60 unit(s) subcutaneous once a day (at bedtime) 07-Jan-16 15:55  Metoprolol Tartrate 50 mg oral tablet 1 tab(s) orally 2 times a day 07-Jan-16 15:55  acetaminophen 325 mg oral tablet 2 tab(s) orally every 6 hours, As Needed for mild pain (1-3/10) or temp. greater than 100.4.  07-Jan-16 15:55  albuterol 2.5 mg/3 mL (0.083%) inhalation solution 3 milliliter(s) inhaled every 6 hours, As Needed for COPD 07-Jan-16 15:55  Ventolin CFC free 90 mcg/inh inhalation aerosol 2 puff(s) inhaled every 6 hours, As Needed - for Shortness of Breath 07-Jan-16 15:55  Eliquis 5 mg oral tablet 1 tab(s) orally 2 times a day 07-Jan-16 15:55  ferrous sulfate 325 mg oral tablet 1 tab(s) orally 2 times a day 07-Jan-16 15:55  Advair Diskus 250 mcg-50 mcg inhalation powder 1 puff(s) inhaled 2 times a day 07-Jan-16 15:55  digoxin 250 mcg (0.25 mg) oral tablet 1 tab(s) orally once a day 07-Jan-16 15:55   Allergies:  No Known Allergies:   Allergies:  Allergies NKDA   Social/Family History: Employment Status: retired  Lives With: children  Living Arrangements: house  Social History: no tob, no EtOH, no illicits  Family History: no seizures,  no stroke   Vital Signs: **Vital Signs.:   08-Jan-16 16:50  Vital Signs Type Routine  Temperature Temperature (F) 98.8  Celsius 37.1  Temperature Source oral  Pulse Pulse 77  Respirations Respirations 16  Systolic BP Systolic BP 809  Diastolic BP (mmHg) Diastolic BP (mmHg) 80  Mean BP 97  Pulse Ox % Pulse Ox % 97  Pulse Ox Activity Level  At rest  Oxygen Delivery 3L   Physical Exam: General: overweight, NAD  HEENT: normocephalic, sclera nonicteric, oropharynx clear  Neck: supple, no JVD, no bruits  Chest: CTA B, no  wheezing, good movement  Cardiac: RRR, no murmurs, no edema, 2+ pulses  Extremities: no C/C/E, FROM   Neurologic Exam: Mental Status: alert and oriented x 2 not time, normal language, follows simple commands, mild dysarthria  Cranial Nerves: PERRLA, EOMI, nl VF, face symmetric, tongue midline, shoulder shrug equal  Motor Exam: 5/5 B normal, tone, no tremor  Deep Tendon Reflexes: 2+/4 B, plantars downgoing B, no Hoffman  Sensory Exam: pinprick, temperature, and vibration intact B  Coordination: FTN and HTS WNL, nl RAM   Lab Results: Thyroid:  08-Jan-16 06:34   Thyroid Stimulating Hormone 1.06 (0.45-4.50 (IU = International Unit)  ----------------------- Pregnant patients have  different reference  ranges for TSH:  - - - - - - - - - -  Pregnant, first trimetser:  0.36 - 2.50 uIU/mL)  Hepatic:  08-Jan-16 06:34   Bilirubin, Total 0.3  Alkaline Phosphatase 108 (46-116 NOTE: New Reference Range 03/23/14)  SGPT (ALT)  68 (14-63 NOTE: New Reference Range 03/23/14)  SGOT (AST)  60  Total Protein, Serum 6.4  Albumin, Serum  2.5  TDMs:  08-Jan-16 06:34   Digoxin, Serum 1.59 (Therapeutic range for digoxin in patients with atrial fibrillation: 0.8 - 2.0 ng/mL. In patients with congestive heart failure a therapeutic range of 0.5 - 0.8 ng/mL is suggested as higher levels are associated with an increased risk of toxicity without clear evidence of enhanced efficacy. Digoxin toxicity is commonly associated with serum levels > 2.0 ng/mL but may occur with lower levels, including those in the therapeutic range. Blood samples should be obtained 6-8 hours after administration to assure a reasonable volume of distribution.)  Phenobarbital, Serum 40.0 (Result(s) reported on 10 Sep 2014 at 11:10AM.)  Routine Micro:  07-Jan-16 16:34   Micro Text Report URINE CULTURE   COMMENT                   COLONIES TOO SMALL TO READ   ANTIBIOTIC                       Specimen Source INDWELLING  CATHETER  Culture Comment COLONIES TOO SMALL TO READ  Result(s) reported on 10 Sep 2014 at 10:32AM.  Routine Chem:  07-Jan-16 14:14   B-Type Natriuretic Peptide [facility])  2322 (Result(s) reported on 09 Sep 2014 at 02:44PM.)    23:07   Result Comment TROPONIN - RESULTS VERIFIED BY REPEAT TESTING.  - PREV. C/ 09-09-14 @1455  BY [facility]...AJO  Result(s) reported on 09 Sep 2014 at 11:56PM.  08-Jan-16 06:34   Phosphorus, Serum 3.2 (Result(s) reported on 10 Sep 2014 at 11:10AM.)  Glucose, Serum  262  BUN 11  Creatinine (comp) 0.84  Sodium, Serum  131  Potassium, Serum 4.6  Chloride, Serum  94  CO2, Serum 28  Calcium (Total), Serum  7.9  Osmolality (calc) 271  eGFR (African American) >60  eGFR (Non-African American) >60 (  eGFR values <49m/min/1.73 m2 may be an indication of chronic kidney disease (CKD). Calculated eGFR, using the MRDR Study equation, is useful in  patients with stable renal function. The eGFR calculation will not be reliable in acutely ill patients when serum creatinine is changing rapidly. It is not useful in patients on dialysis. The eGFR calculation may not be applicable to patients at the low and high extremes of body sizes, pregnant women, and vegetarians.)  Anion Gap 9  Magnesium, Serum  1.6 (1.8-2.4 THERAPEUTIC RANGE: 4-7 mg/dL TOXIC: > 10 mg/dL  -----------------------)  Hemoglobin A1c ([facility])  9.2 (The American Diabetes Association recommends that a primary goal of therapy should be <7% and that physicians should reevaluate the treatment regimen in patients with HbA1c values consistently >8%.)  Cholesterol, Serum 116  Triglycerides, Serum 70  HDL (INHOUSE)  68  VLDL Cholesterol Calculated 14  LDL Cholesterol Calculated 34 (Result(s) reported on 10 Sep 2014 at 07:04AM.)  Misc Urine Chem:  07-Jan-16 16:34   Sodium, Urine Random 66 (Result(s) reported on 09 Sep 2014 at 09:07PM.)  Osmolality, Urine Random 403 (50-1400 300-900 mOsm/kg   with avg Fluid Intake >  850 mOsm/kg  with Fluid Restriction)  Cardiac:  07-Jan-16 23:07   Troponin I  0.07 (0.00-0.05 0.05 ng/mL or less: NEGATIVE  Repeat testing in 3-6 hrs  if clinically indicated. >0.05 ng/mL: POTENTIAL  MYOCARDIAL INJURY. Repeat  testing in 3-6 hrs if  clinically indicated. NOTE: An increase or decrease  of 30% or more on serial  testing suggests a  clinically important change)  CK, Total 136  CPK-MB, Serum 2.4 (Result(s) reported on 09 Sep 2014 at 11:54PM.)  Routine UA:  07-Jan-16 16:34   Color (UA) Yellow  Clarity (UA) Cloudy  Glucose (UA) >=500  Bilirubin (UA) Negative  Ketones (UA) Negative  Specific Gravity (UA) 1.009  Blood (UA) Negative  pH (UA) 5.0  Protein (UA) 100 mg/dL  Nitrite (UA) Negative  Leukocyte Esterase (UA) Negative (Result(s) reported on 09 Sep 2014 at 05:01PM.)  RBC (UA) 60 /HPF  WBC (UA) 35 /HPF  Bacteria (UA) NONE SEEN  Epithelial Cells (UA) 1 /HPF  Mucous (UA) PRESENT (Result(s) reported on 09 Sep 2014 at 05:01PM.)  Routine Hem:  08-Jan-16 06:34   WBC (CBC) 4.4  RBC (CBC)  3.76  Hemoglobin (CBC)  11.6  Hematocrit (CBC) 35.9  Platelet Count (CBC) 216  MCV 96  MCH 31.0  MCHC 32.4  RDW  15.6  Neutrophil % 82.6  Lymphocyte % 11.5  Monocyte % 5.7  Eosinophil % 0.1  Basophil % 0.1  Neutrophil # 3.6  Lymphocyte #  0.5  Monocyte # 0.2  Eosinophil # 0.0  Basophil # 0.0 (Result(s) reported on 10 Sep 2014 at 07:03AM.)   Radiology Results: CT:    07-Jan-16 18:06, CT Head Without Contrast  CT Head Without Contrast   REASON FOR EXAM:    SYNCOPE  COMMENTS:       PROCEDURE: CT  - CT HEAD WITHOUT CONTRAST  - Sep 09 2014  6:06PM     CLINICAL DATA:  Status post fall today.    EXAM:  CT HEAD WITHOUT CONTRAST    TECHNIQUE:  Contiguous axial images were obtained from thebase of the skull  through the vertex without intravenous contrast.    COMPARISON:  None.  FINDINGS:  There is chronic diffuse atrophy. Chronic bilateral  periventricular  white matter small vessel ischemic changes identified. There is no  midline shift, hydrocephalus, or mass.  No acute hemorrhage or acute  transcortical infarct is identified. The bony calvarium is intact.  The visualized sinuses are clear.     IMPRESSION:  No focal acute intracranial abnormality identified. Chronic diffuse  atrophy. Mild chronic bilateral periventricular white matter small  vessel ischemic change.      Electronically Signed    By: Abelardo Diesel M.D.    On: 09/09/2014 18:14         Verified By: Abelardo Diesel, M.D.,   Radiology Impression: Radiology Impression: CT of head personally reviewed by me and shows mild white matter changes and some atrophy   Impression/Recommendations: Recommendations:   prior notes reviewed by me  reviewed by me normal   Probable micturation syncope-  no evidence by hx of electrographically of seizures Mild chronic white matter disease-  likely causing some mild cognitive changes Mild encephalopathy- likely due to hypercapneia and other medical problems;  this should resolve continue Eqiluis no need for anti-epileptics at this time no neurologic restrictions  will sign out, no need for neurologic f/u  Electronic Signatures: Jamison Neighbor (MD)  (Signed 08-Jan-16 19:17)  Authored: REFERRING PHYSICIAN, Primary Care Physician, Consult, History of Present Illness, Review of Systems, PAST MEDICAL/SURGICAL HISTORY, HOME MEDICATIONS, ALLERGIES, Social/Family History, NURSING VITAL SIGNS, Physical Exam-, LAB RESULTS, RADIOLOGY RESULTS, Recommendations   Last Updated: 08-Jan-16 19:17 by Jamison Neighbor (MD)

## 2015-01-02 NOTE — Discharge Summary (Signed)
PATIENT NAME:  Morgan Meyer, Morgan Meyer MR#:  [id number] DATE OF BIRTH:  17-Sep-1940  DATE OF ADMISSION:  09/09/2014 DATE OF DISCHARGE:  09/14/2014  DISPOSITION: [facility]  CODE STATUS: Limited code.  CONSULTATIONS: Cardiology, nephrology.  DISCHARGE DIAGNOSES: 1.  Acute respiratory failure secondary to pneumonia.  2.  Chronic obstructive pulmonary disease exacerbation.  3.  Elevated troponin secondary to chronic obstructive pulmonary disease and pneumonia.  4.  Chronic atrial fibrillation.  5.  Type 2 diabetes mellitus with hyperglycemia due to steroids. 6.  Hyponatremia.  7.  Left shoulder, leg pain and chronic back pain. 8.  History of seizure disorder. 9.  Hypothyroidism.   DISCHARGE MEDICATIONS: Digoxin 250 mcg p.o. daily, phenobarbital 64.8 mg p.o. b.i.d., levothyroxine 75 mcg p.o. daily, fish oil 1200 mg p.o. b.i.d., Cardizem CD 120 mg p.o. daily, metformin 1 gram p.o. b.i.d., Ventolin 2 puffs every 6 hours, albuterol 3 mL every 6 hours as needed for COPD, Eliquis 5 mg p.o. b.i.d., ferrous sulfate 325 p.o. b.i.d., Advair Diskus 250/50 one puff b.i.d., NovoLog FlexPen 20 units t.i.d. before meals, atorvastatin 20 mg p.o. daily, metoprolol tartrate 50 mg every 12 hours, enalapril 2.5 mg p.o. daily, ibuprofen 400 mg every 6 hours as needed for pain 1 to 3/10, Skelaxin 800 mg p.o. t.i.d. (this is for right shoulder pain, that can be discontinued if the patient's pain is better or if she feels sleepy), lidocaine 5% topical ointment to the back daily, Colace Docusate with senna 50/8.6 mg 1 tablet p.o. t.i.d., Augmentin 875/125 mg p.o. b.i.d. for 10 days, Zithromax 500 mg p.o. daily for 3 days, prednisone dose tablet 20 mg 3 tablets daily for 2 days, 2 tablets daily for 2 days, then 1 tablet daily for 2 days, Lantus 45 units at bedtime, Percocet 5/325 every 6 hours as needed for pain 4 to 6/10 and 4 to 6/10.   The patient's Lantus 50 units is  stopped and Levaquin is stopped.   DISCHARGE DIET:  Low-sodium, low-fat, ADA diet.   HOSPITAL COURSE:  1.  This is a 74 year old female patient brought in by the son because of shortness of breath, lethargy and cough. The patient's oxygen sats were 84 to 88% on room air. She was admitted for COPD exacerbation, started on BiPAP, and her breathing improved with IV steroids, nebulizers, and the patient came off the BiPAP. Initially she was admitted to CCU stepdown. The patient's respiratory status improved, and the patient's chest x-ray is repeated. The patient found to have pneumonia. This was repeated because she was having lots of cough and shortness of breath.  X-ray showed bilateral lower lobe pneumonia. The patient was getting Zithromax, added Rocephin as well. The patient right now is on 2 liters oxygen and sats are 98%. The patient's white count is normal and she is afebrile. The patient is going to [facility] and she will continue tapering course of steroids and antibiotics.  2.  Lethargy, secondary to pneumonia, COPD and hyponatremia. The patient's sodium was low on admission. It was 125. The patient initially received IV fluids, but sodium continued to drop. Initially dropped to 132, but then dropped again to 128, so I have stopped the IV fluids and we gave 1 dose of Lasix 40 mg. The patient's hyponatremia is corrected and sodium improved from 128 to 131. Seen by nephrology as well.  3.  Diabetes mellitus, type 2, with uncontrolled blood sugars due to steroids. The patient's Lantus has been stopped initially because of concern for some hypoglycemia  at home, but the patient's sugars were up in 400s so restarted the Lantus. Right now she is on Lantus 45 units at bedtime and also NovoLog with meals. The patient also on metformin. That can be continued.  4.  Elevated troponins. The patient did not have any chest pain, and the patient's initial troponin was 0.06. EKG showed A-fib with rate of 94 beats per minute. Because of troponin leak of 0.06 to 0.07,  seen by cardiology. The patient seen by Dr. Neoma Laming. He suggested that the patient can see him as an outpatient for Lexiscan stress test to evaluate for CAD. 5.  Chronic back pain. She is on Lidoderm patch. That was restarted. 6.  History of fall at home with left shoulder pain. The patient had very severe left shoulder pain. X-ray of the left shoulder has been negative for fractures, started on Percocet and Skelaxin. The patient says that she has less pain for the last 2 days and she is working with physical therapy.   The patient is going to [facility] and family, especially the son, and the patient are in agreement for it.   PHYSICAL EXAMINATION:  DISCHARGE VITAL SIGNS: Temperature 98 Fahrenheit, heart rate 84, blood pressure 141/81, sats 91 to 98%. GENERAL: She is alert, awake, oriented.  CARDIOVASCULAR: S1, S2 regular.  LUNGS: Clear to auscultation. No wheeze. No rales.  ABDOMEN: Soft, nontender, nondistended. Bowel sounds present.   The patient is from [place] and she has to get settled here and have a primary doctor here. Code status limited code. Seen by palliative care, discussed the code status, and the patient wants CPR but does not want intubation or mechanical ventilation, but she is okay with antiarrhythmic,vasoactive  drugs and defibrillator. The patient's urine cultures have been negative. She has chronic A-fib. The rate is controlled. She is on Eliquis. That is continued. Heart rate is around 84.   TIME SPENT: More than 30 minutes.   ____________________________ Epifanio Lesches, MD sk:sb D: 09/14/2014 11:47:37 ET T: 09/14/2014 12:19:14 ET JOB#: [id number]  cc: Epifanio Lesches, MD, <Dictator> Dionisio David, MD Mamie Levers, MD Epifanio Lesches MD ELECTRONICALLY SIGNED 10/05/2014 11:45

## 2015-01-05 ENCOUNTER — Ambulatory Visit: Payer: Medicare Other | Admitting: Internal Medicine

## 2015-01-10 ENCOUNTER — Encounter: Payer: Self-pay | Admitting: *Deleted

## 2015-01-11 NOTE — Discharge Instructions (Signed)

## 2015-01-13 ENCOUNTER — Ambulatory Visit: Payer: Medicare Other | Admitting: Student in an Organized Health Care Education/Training Program

## 2015-01-13 ENCOUNTER — Other Ambulatory Visit: Payer: Self-pay | Admitting: Gastroenterology

## 2015-01-13 ENCOUNTER — Encounter
Admission: RE | Disposition: A | Discharge status: Home / Self Care | Payer: Self-pay | Source: Ambulatory Visit | Attending: Gastroenterology

## 2015-01-13 ENCOUNTER — Ambulatory Visit
Admission: RE | Admit: 2015-01-13 | Discharge: 2015-01-13 | Disposition: A | Discharge status: Home / Self Care | Payer: Medicare Other | Source: Ambulatory Visit | Attending: Gastroenterology | Admitting: Gastroenterology

## 2015-01-13 DIAGNOSIS — Z79899 Other long term (current) drug therapy: Secondary | ICD-10-CM | POA: Diagnosis not present

## 2015-01-13 DIAGNOSIS — J449 Chronic obstructive pulmonary disease, unspecified: Secondary | ICD-10-CM | POA: Insufficient documentation

## 2015-01-13 DIAGNOSIS — E039 Hypothyroidism, unspecified: Secondary | ICD-10-CM | POA: Diagnosis not present

## 2015-01-13 DIAGNOSIS — I10 Essential (primary) hypertension: Secondary | ICD-10-CM | POA: Insufficient documentation

## 2015-01-13 DIAGNOSIS — Z9049 Acquired absence of other specified parts of digestive tract: Secondary | ICD-10-CM | POA: Insufficient documentation

## 2015-01-13 DIAGNOSIS — R1032 Left lower quadrant pain: Secondary | ICD-10-CM | POA: Insufficient documentation

## 2015-01-13 DIAGNOSIS — D125 Benign neoplasm of sigmoid colon: Secondary | ICD-10-CM | POA: Insufficient documentation

## 2015-01-13 DIAGNOSIS — I4891 Unspecified atrial fibrillation: Secondary | ICD-10-CM | POA: Insufficient documentation

## 2015-01-13 DIAGNOSIS — Z87891 Personal history of nicotine dependence: Secondary | ICD-10-CM | POA: Diagnosis not present

## 2015-01-13 DIAGNOSIS — R0602 Shortness of breath: Secondary | ICD-10-CM | POA: Insufficient documentation

## 2015-01-13 DIAGNOSIS — K640 First degree hemorrhoids: Secondary | ICD-10-CM | POA: Diagnosis not present

## 2015-01-13 DIAGNOSIS — E119 Type 2 diabetes mellitus without complications: Secondary | ICD-10-CM | POA: Insufficient documentation

## 2015-01-13 DIAGNOSIS — M19041 Primary osteoarthritis, right hand: Secondary | ICD-10-CM | POA: Insufficient documentation

## 2015-01-13 DIAGNOSIS — Z7951 Long term (current) use of inhaled steroids: Secondary | ICD-10-CM | POA: Diagnosis not present

## 2015-01-13 DIAGNOSIS — R0600 Dyspnea, unspecified: Secondary | ICD-10-CM | POA: Insufficient documentation

## 2015-01-13 DIAGNOSIS — Z794 Long term (current) use of insulin: Secondary | ICD-10-CM | POA: Insufficient documentation

## 2015-01-13 DIAGNOSIS — K529 Noninfective gastroenteritis and colitis, unspecified: Secondary | ICD-10-CM | POA: Insufficient documentation

## 2015-01-13 DIAGNOSIS — R197 Diarrhea, unspecified: Secondary | ICD-10-CM | POA: Insufficient documentation

## 2015-01-13 DIAGNOSIS — K219 Gastro-esophageal reflux disease without esophagitis: Secondary | ICD-10-CM | POA: Diagnosis not present

## 2015-01-13 DIAGNOSIS — F0390 Unspecified dementia without behavioral disturbance: Secondary | ICD-10-CM | POA: Insufficient documentation

## 2015-01-13 DIAGNOSIS — Z90710 Acquired absence of both cervix and uterus: Secondary | ICD-10-CM | POA: Diagnosis not present

## 2015-01-13 DIAGNOSIS — M19042 Primary osteoarthritis, left hand: Secondary | ICD-10-CM | POA: Diagnosis not present

## 2015-01-13 DIAGNOSIS — D124 Benign neoplasm of descending colon: Secondary | ICD-10-CM | POA: Insufficient documentation

## 2015-01-13 DIAGNOSIS — G40909 Epilepsy, unspecified, not intractable, without status epilepticus: Secondary | ICD-10-CM | POA: Insufficient documentation

## 2015-01-13 HISTORY — DX: Complete loss of teeth, unspecified cause, unspecified class: Z97.2

## 2015-01-13 HISTORY — DX: Complete loss of teeth, unspecified cause, unspecified class: K08.109

## 2015-01-13 HISTORY — DX: Type 2 diabetes mellitus without complications: E11.9

## 2015-01-13 HISTORY — DX: Reserved for inherently not codable concepts without codable children: IMO0001

## 2015-01-13 HISTORY — DX: Chronic obstructive pulmonary disease, unspecified: J44.9

## 2015-01-13 HISTORY — DX: Cardiac arrhythmia, unspecified: I49.9

## 2015-01-13 HISTORY — DX: Unspecified asthma, uncomplicated: J45.909

## 2015-01-13 HISTORY — PX: POLYPECTOMY: SHX149

## 2015-01-13 HISTORY — PX: COLONOSCOPY: SHX5424

## 2015-01-13 HISTORY — DX: Unspecified osteoarthritis, unspecified site: M19.90

## 2015-01-13 HISTORY — DX: Essential (primary) hypertension: I10

## 2015-01-13 HISTORY — DX: Unspecified dementia, unspecified severity, without behavioral disturbance, psychotic disturbance, mood disturbance, and anxiety: F03.90

## 2015-01-13 HISTORY — DX: Unspecified injury of shoulder and upper arm, unspecified arm, initial encounter: S49.90XA

## 2015-01-13 HISTORY — DX: Gastro-esophageal reflux disease without esophagitis: K21.9

## 2015-01-13 HISTORY — DX: Myoneural disorder, unspecified: G70.9

## 2015-01-13 HISTORY — DX: Epilepsy, unspecified, not intractable, without status epilepticus: G40.909

## 2015-01-13 HISTORY — DX: Hypothyroidism, unspecified: E03.9

## 2015-01-13 LAB — GLUCOSE, CAPILLARY
Glucose-Capillary: 70 mg/dL (ref 65–99)
Glucose-Capillary: 120 mg/dL — ABNORMAL HIGH (ref 65–99)

## 2015-01-13 SURGERY — COLONOSCOPY
Anesthesia: Monitor Anesthesia Care | Wound class: Contaminated

## 2015-01-13 MED ORDER — SODIUM CHLORIDE 0.9 % IV SOLN: INTRAVENOUS | Status: DC

## 2015-01-13 MED ORDER — DEXTROSE 50 % IV SOLN
12.5000 g | Freq: Once | INTRAVENOUS | Status: AC
  Administered 2015-01-13: 12.5 g via INTRAVENOUS

## 2015-01-13 MED ORDER — STERILE WATER FOR IRRIGATION IR SOLN
Status: DC | PRN
  Administered 2015-01-13: 08:00:00

## 2015-01-13 MED ORDER — LACTATED RINGERS IV SOLN
INTRAVENOUS | Status: DC
  Administered 2015-01-13 (×2): via INTRAVENOUS

## 2015-01-13 MED ORDER — PROPOFOL 10 MG/ML IV BOLUS
INTRAVENOUS | Status: DC | PRN
  Administered 2015-01-13: 30 mg via INTRAVENOUS
  Administered 2015-01-13: 20 mg via INTRAVENOUS
  Administered 2015-01-13: 30 mg via INTRAVENOUS
  Administered 2015-01-13: 20 mg via INTRAVENOUS
  Administered 2015-01-13 (×2): 30 mg via INTRAVENOUS

## 2015-01-13 MED ORDER — LIDOCAINE HCL (CARDIAC) 20 MG/ML IV SOLN
INTRAVENOUS | Status: DC | PRN
  Administered 2015-01-13: 30 mg via INTRAVENOUS

## 2015-01-13 SURGICAL SUPPLY — 27 items
CANISTER SUCT 1200ML W/VALVE (MISCELLANEOUS) ×4 IMPLANT
FCP ESCP3.2XJMB 240X2.8X (MISCELLANEOUS)
FORCEPS BIOP RAD 4 LRG CAP 4 (CUTTING FORCEPS) ×4 IMPLANT
FORCEPS BIOP RJ4 240 W/NDL (MISCELLANEOUS)
FORCEPS ESCP3.2XJMB 240X2.8X (MISCELLANEOUS) IMPLANT
GOWN CVR UNV OPN BCK APRN NK (MISCELLANEOUS) ×4 IMPLANT
GOWN ISOL THUMB LOOP REG UNIV (MISCELLANEOUS) ×4
HEMOCLIP INSTINCT (CLIP) IMPLANT
INJECTOR VARIJECT VIN23 (MISCELLANEOUS) IMPLANT
KIT CO2 TUBING (TUBING) ×4 IMPLANT
KIT DEFENDO VALVE AND CONN (KITS) IMPLANT
KIT ENDO PROCEDURE OLY (KITS) ×4 IMPLANT
LIGATOR MULTIBAND 6SHOOTER MBL (MISCELLANEOUS) IMPLANT
MARKER SPOT ENDO TATTOO 5ML (MISCELLANEOUS) IMPLANT
PAD GROUND ADULT SPLIT (MISCELLANEOUS) IMPLANT
SNARE SHORT THROW 13M SML OVAL (MISCELLANEOUS) ×4 IMPLANT
SNARE SHORT THROW 30M LRG OVAL (MISCELLANEOUS) IMPLANT
SPOT EX ENDOSCOPIC TATTOO (MISCELLANEOUS)
SUCTION POLY TRAP 4CHAMBER (MISCELLANEOUS) IMPLANT
TRAP SUCTION POLY (MISCELLANEOUS) ×4 IMPLANT
TUBING CONN 6MMX3.1M (TUBING)
TUBING SUCTION CONN 0.25 STRL (TUBING) IMPLANT
UNDERPAD 30X60 958B10 (PK) (MISCELLANEOUS) IMPLANT
VALVE BIOPSY ENDO (VALVE) IMPLANT
VARIJECT INJECTOR VIN23 (MISCELLANEOUS)
WATER AUXILLARY (MISCELLANEOUS) IMPLANT
WATER STERILE IRR 500ML POUR (IV SOLUTION) ×4 IMPLANT

## 2015-01-13 NOTE — Op Note (Signed)
[facility] Patient Name: Morgan Meyer Procedure Date: 01/13/2015 8:02 AM MRN: [id number] Account #: [id number] Date of Birth: 03/14/1941 Admit Type: Outpatient Age: 74 Room: [facility] OR ROOM 01 Gender: Female Note Status: Finalized Procedure:         Colonoscopy Indications:       Abdominal pain in the left lower quadrant, Chronic diarrhea Providers:         Lucilla Lame, MD Medicines:         Propofol per Anesthesia Complications:     No immediate complications. Procedure:         Pre-Anesthesia Assessment:                    - Prior to the procedure, a History and Physical was                     performed, and patient medications and allergies were                     reviewed. The patient's tolerance of previous anesthesia                     was also reviewed. The risks and benefits of the procedure                     and the sedation options and risks were discussed with the                     patient. All questions were answered, and informed consent                     was obtained. Prior Anticoagulants: The patient has taken                     no previous anticoagulant or antiplatelet agents. ASA                     Grade Assessment: II - A patient with mild systemic                     disease. After reviewing the risks and benefits, the                     patient was deemed in satisfactory condition to undergo                     the procedure.                    After obtaining informed consent, the colonoscope was                     passed under direct vision. Throughout the procedure, the                     patient's blood pressure, pulse, and oxygen saturations                     were monitored continuously. The Olympus CF H180AL                     colonoscope (S#: [id number]) was introduced through the anus                     and advanced to the  the terminal ileum. The colonoscopy                     was performed without  difficulty. The patient tolerated                     the procedure well. The quality of the bowel preparation                     was excellent. Findings:      The perianal and digital rectal examinations were normal.      Two sessile polyps were found in the descending colon. The polyps were 3       to 6 mm in size. These polyps were removed with a cold snare. Resection       and retrieval were complete.      Two sessile polyps were found in the sigmoid colon. The polyps were 7 to       8 mm in size. These polyps were removed with a cold snare. Resection and       retrieval were complete.      Non-bleeding internal hemorrhoids were found during retroflexion. The       hemorrhoids were Grade I (internal hemorrhoids that do not prolapse).      The terminal ileum appeared normal. Biopsies were taken with a cold       forceps for histology.      Random biopsies were obtained with cold forceps for histology in the       entire colon. Impression:        - Two 3 to 6 mm polyps in the descending colon. Resected                     and retrieved.                    - Two 7 to 8 mm polyps in the sigmoid colon. Resected and                     retrieved.                    - Non-bleeding internal hemorrhoids.                    - The examined portion of the ileum was normal. Biopsied.                    - Random biopsies were obtained in the entire colon. Recommendation:    - Repeat colonoscopy in 5 years if polyp adenoma and 10                     years if hyperplastic                    - Await pathology results. Procedure Code(s): --- Professional ---                    [phone], Colonoscopy, flexible; with removal of tumor(s),                     polyp(s), or other lesion(s) by snare technique                    98338, 73, Colonoscopy, flexible; with biopsy, single or  multiple Diagnosis Code(s): --- Professional ---                    R10.32, Left lower quadrant pain                     K52.9, Noninfective gastroenteritis and colitis,                     unspecified                    D12.5, Benign neoplasm of sigmoid colon                    D12.4, Benign neoplasm of descending colon CPT copyright 2014 American Medical Association. All rights reserved. The codes documented in this report are preliminary and upon coder review may  be revised to meet current compliance requirements. Lucilla Lame, MD 01/13/2015 8:39:31 AM This report has been signed electronically. Number of Addenda: 0 Note Initiated On: 01/13/2015 8:02 AM Scope Withdrawal Time: 0 hours 10 minutes 15 seconds  Total Procedure Duration: 0 hours 15 minutes 5 seconds       [facility]

## 2015-01-13 NOTE — H&P (Signed)
[facility]  [address] [place], [place] [address] Phone: [phone] Fax : [phone]  Primary Care Physician:  Viviana Simpler, MD Primary Gastroenterologist:  Dr. Allen Norris  Pre-Procedure History & Physical: HPI:  Morgan Meyer is a 74 y.o. female is here for an colonoscopy.   Past Medical History  Diagnosis Date  . Hypertension   . Dysrhythmia     A-Fib.  3/16 EKG in paper chart  . Asthma   . COPD (chronic obstructive pulmonary disease)     Chronic bronchitis - 3/16 CXR in paper chart.  . Shortness of breath dyspnea   . Epilepsy     Hx: Grand mal seizures, noe in long time - takes phenobarb  . Shoulder injury     torn RC (approx 1 yr ago) - no surgery  . Arthritis     hands  . Neuromuscular disorder     right finger numbness s/p shoulder injury  . Dementia     mild - poor historian  . GERD (gastroesophageal reflux disease)   . Diabetes mellitus without complication   . Hypothyroidism   . Full dentures     upper and lower    Past Surgical History  Procedure Laterality Date  . Abdominal hysterectomy    . Cholecystectomy    . Appendectomy      Prior to Admission medications   Medication Sig Start Date End Date Taking? Authorizing Provider  albuterol (PROVENTIL HFA;VENTOLIN HFA) 108 (90 BASE) MCG/ACT inhaler Inhale 2 puffs into the lungs every 6 (six) hours as needed for wheezing or shortness of breath.   Yes Historical Provider, MD  apixaban (ELIQUIS) 5 MG TABS tablet Take 5 mg by mouth 2 (two) times daily.   Yes Historical Provider, MD  atorvastatin (LIPITOR) 20 MG tablet Take 20 mg by mouth daily. AM   Yes Historical Provider, MD  digoxin (LANOXIN) 0.125 MG tablet Take 0.125 mg by mouth daily. AM   Yes Historical Provider, MD  diltiazem (CARDIZEM) 120 MG tablet Take 120 mg by mouth daily. AM   Yes Historical Provider, MD  enalapril (VASOTEC) 2.5 MG tablet Take 2.5 mg by mouth daily. AM   Yes Historical Provider, MD    Fluticasone-Salmeterol (ADVAIR) 250-50 MCG/DOSE AEPB Inhale 1 puff into the lungs 2 (two) times daily as needed.    Yes Historical Provider, MD  furosemide (LASIX) 20 MG tablet Take 20 mg by mouth every other day. AM   Yes Historical Provider, MD  ibuprofen (ADVIL,MOTRIN) 400 MG tablet Take 400 mg by mouth every 6 (six) hours as needed.   Yes Historical Provider, MD  insulin glargine (LANTUS) 100 UNIT/ML injection Inject 38 Units into the skin at bedtime.    Yes Historical Provider, MD  insulin lispro (HUMALOG) 100 UNIT/ML injection Inject 14 Units into the skin 3 (three) times daily before meals.   Yes Historical Provider, MD  levothyroxine (SYNTHROID, LEVOTHROID) 88 MCG tablet Take 88 mcg by mouth daily before breakfast. AM   Yes Historical Provider, MD  metFORMIN (GLUCOPHAGE) 1000 MG tablet Take 1,000 mg by mouth 2 (two) times daily with a meal.   Yes Historical Provider, MD  metoprolol (LOPRESSOR) 50 MG tablet Take 50 mg by mouth 2 (two) times daily.   Yes Historical Provider, MD  Multiple Vitamin (MULTIVITAMIN) capsule Take 1 capsule by mouth daily.   Yes Historical Provider, MD  Omega-3 Fatty Acids (FISH OIL BURP-LESS PO) Take by mouth daily.   Yes Historical Provider, MD  PHENobarbital (LUMINAL) 64.8 MG tablet Take 129.6 mg by mouth 2 (two) times daily.   Yes Historical Provider, MD    Allergies as of 01/05/2015  . (Not on File)    No family history on file.  History   Social History  . Marital Status: Single    Spouse Name: N/A  . Number of Children: N/A  . Years of Education: N/A   Occupational History  . Not on file.   Social History Main Topics  . Smoking status: Former Smoker    Quit date: 09/03/1984  . Smokeless tobacco: Not on file  . Alcohol Use: No  . Drug Use: Not on file  . Sexual Activity: Not on file   Other Topics Concern  . Not on file   Social History Narrative  . No narrative on file    Review of Systems: See HPI, otherwise negative  ROS  Physical Exam: Pulse 76  Temp(Src) 97.9 F (36.6 C)  Resp 20  Ht 5\' 7"  (1.702 m)  Wt 206 lb (93.441 kg)  BMI 32.26 kg/m2  SpO2 98% General:   Alert,  pleasant and cooperative in NAD Head:  Normocephalic and atraumatic. Neck:  Supple; no masses or thyromegaly. Lungs:  Clear throughout to auscultation.    Heart:  Regular rate and rhythm. Abdomen:  Soft, nontender and nondistended. Normal bowel sounds, without guarding, and without rebound.   Neurologic:  Alert and  oriented x4;  grossly normal neurologically.  Impression/Plan: Morgan Meyer is here for an colonoscopy to be performed for Diarrhea and LLQ pain  Risks, benefits, limitations, and alternatives regarding  colonoscopy have been reviewed with the patient.  Questions have been answered.  All parties agreeable.   [facility] MD  01/13/2015, 7:40 AM

## 2015-01-13 NOTE — Transfer of Care (Signed)
Immediate Anesthesia Transfer of Care Note  Patient: Morgan Meyer  Procedure(s) Performed: Procedure(s) with comments: COLONOSCOPY (N/A) - cecum- 0826  specimens taken POLYPECTOMY INTESTINAL  Patient Location: PACU  Anesthesia Type: MAC  Level of Consciousness: awake, alert  and patient cooperative  Airway and Oxygen Therapy: Patient Spontanous Breathing and Patient connected to supplemental oxygen  Post-op Assessment: Post-op Vital signs reviewed, Patient's Cardiovascular Status Stable, Respiratory Function Stable, Patent Airway and No signs of Nausea or vomiting  Post-op Vital Signs: Reviewed and stable  Complications: No apparent anesthesia complications

## 2015-01-13 NOTE — Anesthesia Postprocedure Evaluation (Signed)
  Anesthesia Post-op Note  Patient: Morgan Meyer  Procedure(s) Performed: Procedure(s) with comments: COLONOSCOPY (N/A) - cecum- [phone]  specimens taken POLYPECTOMY INTESTINAL  Anesthesia type:MAC  Patient location: PACU  Post pain: Pain level controlled  Post assessment: Post-op Vital signs reviewed, Patient's Cardiovascular Status Stable, Respiratory Function Stable, Patent Airway and No signs of Nausea or vomiting  Post vital signs: Reviewed and stable  Last Vitals:  Filed Vitals:   01/13/15 0847  BP: 102/54  Pulse: 82  Temp:   Resp: 11    Level of consciousness: awake, alert  and patient cooperative  Complications: No apparent anesthesia complications

## 2015-01-13 NOTE — Anesthesia Preprocedure Evaluation (Addendum)
Anesthesia Evaluation  Patient identified by MRN, date of birth, ID band  Reviewed: Allergy & Precautions, H&P , NPO status , Patient's Chart, lab work & pertinent test results  History of Anesthesia Complications Negative for: history of anesthetic complications  Airway Mallampati: II  TM Distance: >3 FB Neck ROM: full    Dental no notable dental hx. (+) Edentulous Upper, Edentulous Lower   Pulmonary asthma , COPDformer smoker,    Pulmonary exam normal       Cardiovascular Exercise Tolerance: Good hypertension, + dysrhythmias Atrial Fibrillation Rhythm:irregular Rate:Abnormal  Lipids;   Neuro/Psych Seizures - (15 years ago),  Mild dementia     GI/Hepatic GERD-  Controlled,  Endo/Other  diabetesHypothyroidism   Renal/GU      Musculoskeletal  (+) Arthritis -,   Abdominal   Peds  Hematology   Anesthesia Other Findings Ekg: afib with RBBB; Last eliquis: 5/11; Echo: 09/2014: ef=60%;  Reproductive/Obstetrics                            Anesthesia Physical Anesthesia Plan  ASA: III  Anesthesia Plan: MAC   Post-op Pain Management:    Induction:   Airway Management Planned:   Additional Equipment:   Intra-op Plan:   Post-operative Plan:   Informed Consent: I have reviewed the patients History and Physical, chart, labs and discussed the procedure including the risks, benefits and alternatives for the proposed anesthesia with the patient or authorized representative who has indicated his/her understanding and acceptance.     Plan Discussed with: CRNA  Anesthesia Plan Comments:        Anesthesia Quick Evaluation

## 2015-01-14 ENCOUNTER — Encounter: Payer: Self-pay | Admitting: Gastroenterology

## 2015-02-26 IMAGING — CR DG CHEST 1V PORT
1 series · 1 of 1 positions shown · non-contrast
Comparison: Portable chest x-ray 08/27/2014.

CLINICAL DATA: Patient fell off of the toilet at her home earlier
today. Hypoxemia and wheezing. Patient on CPAP. Initial encounter.

EXAM:
PORTABLE CHEST - 1 VIEW

[ap]
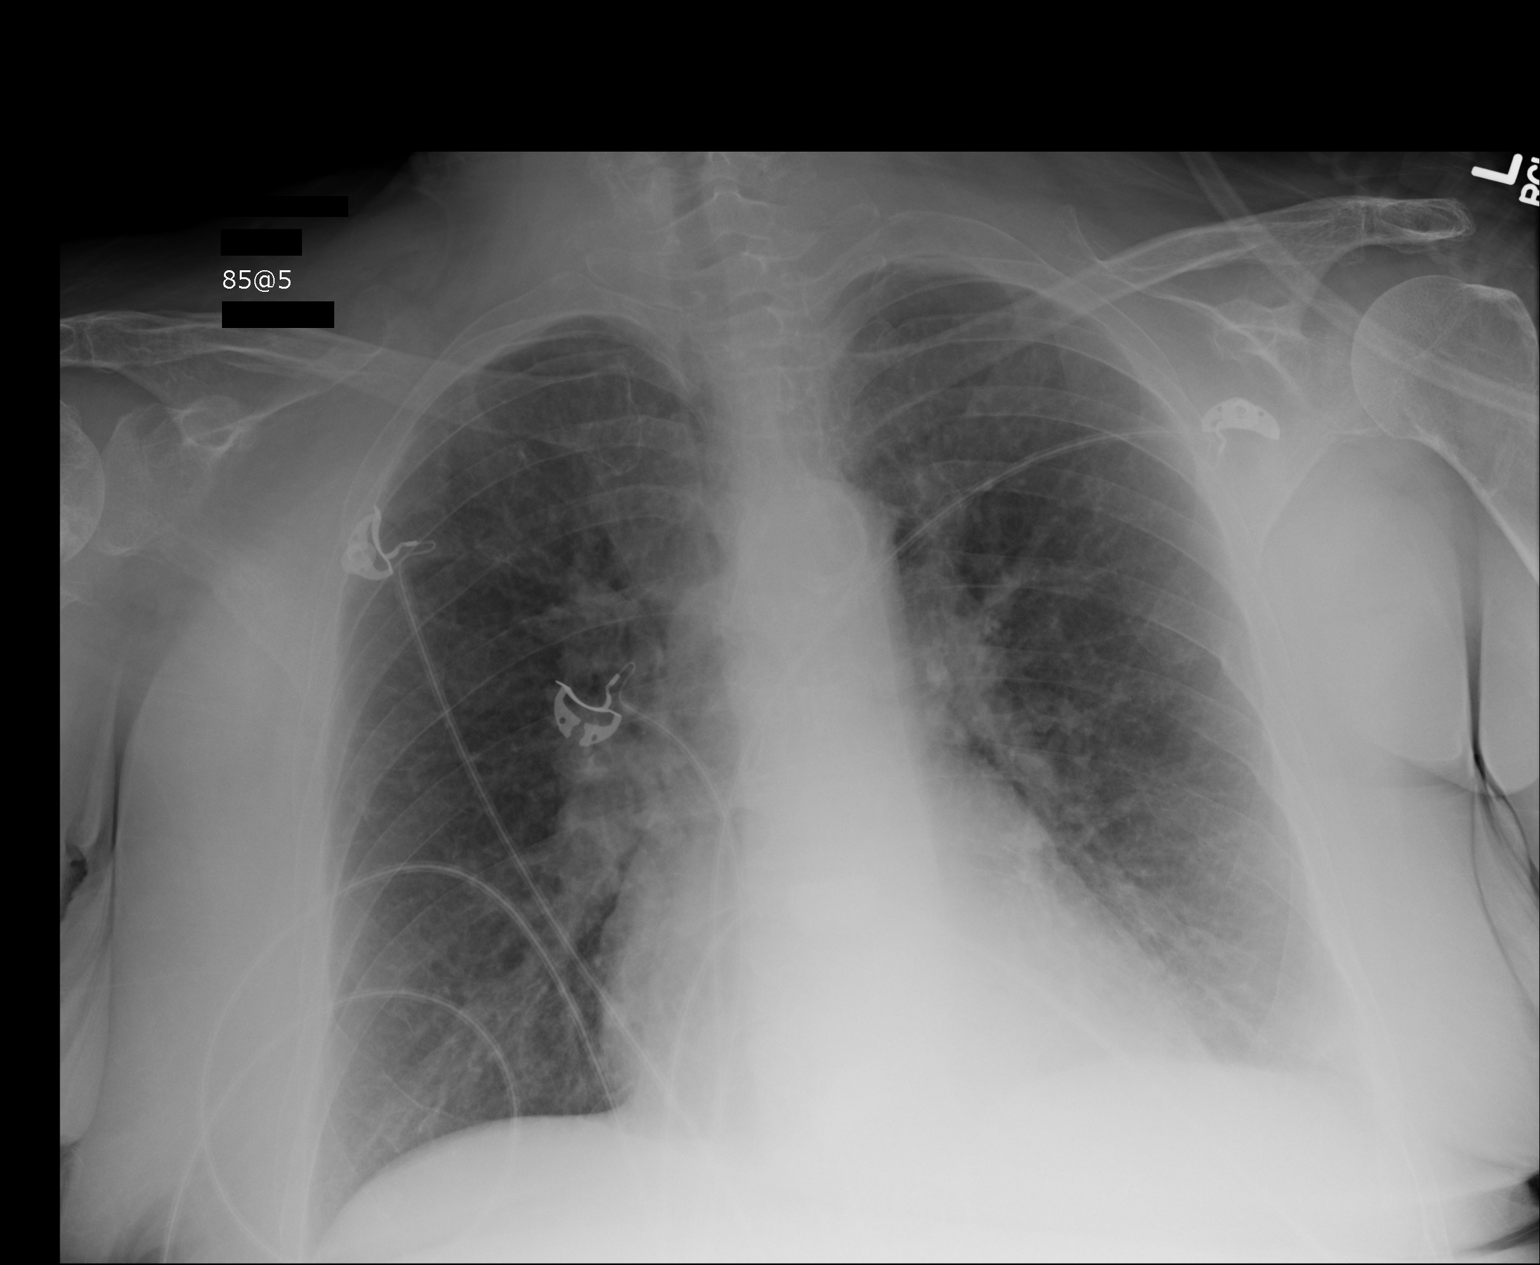

[1 of 1 positions shown; findings below may reference images not displayed]

FINDINGS: Cardiac silhouette mildly enlarged but stable. Prominent
bronchovascular markings diffusely and mild central peribronchial
thickening, unchanged. Interval resolution of interstitial pulmonary
edema. Interval resolution of the right pleural effusion. Lungs
otherwise clear.
IMPRESSION: Stable mild changes of chronic bronchitis and/or asthma. No acute
cardiopulmonary disease. Interval resolution of interstitial
pulmonary edema since 08/27/2014.

## 2015-05-09 IMAGING — CR DG CHEST 2V
1 series · 2 of 2 positions shown · non-contrast
Comparison: 09/12/2014

CLINICAL DATA: Patient presents to the ED with shortness of breath
when she awoke this am. Patient states she was having difficulty
breathing and increased difficulty breathing during normal ADLs.
Patient reports swelling in feet at baseline for the past 6 months.
Patient took a breathing treatment recently and states her shortness
of breath improved slightly. Patient speaking in full sentences.
Patient has a history of Afib.

EXAM:
CHEST  2 VIEW

[Series 1: dxr chest pa (or ap) and lateral · 0.14mm/px · 2 of 2 slices shown]
[im 1/2]
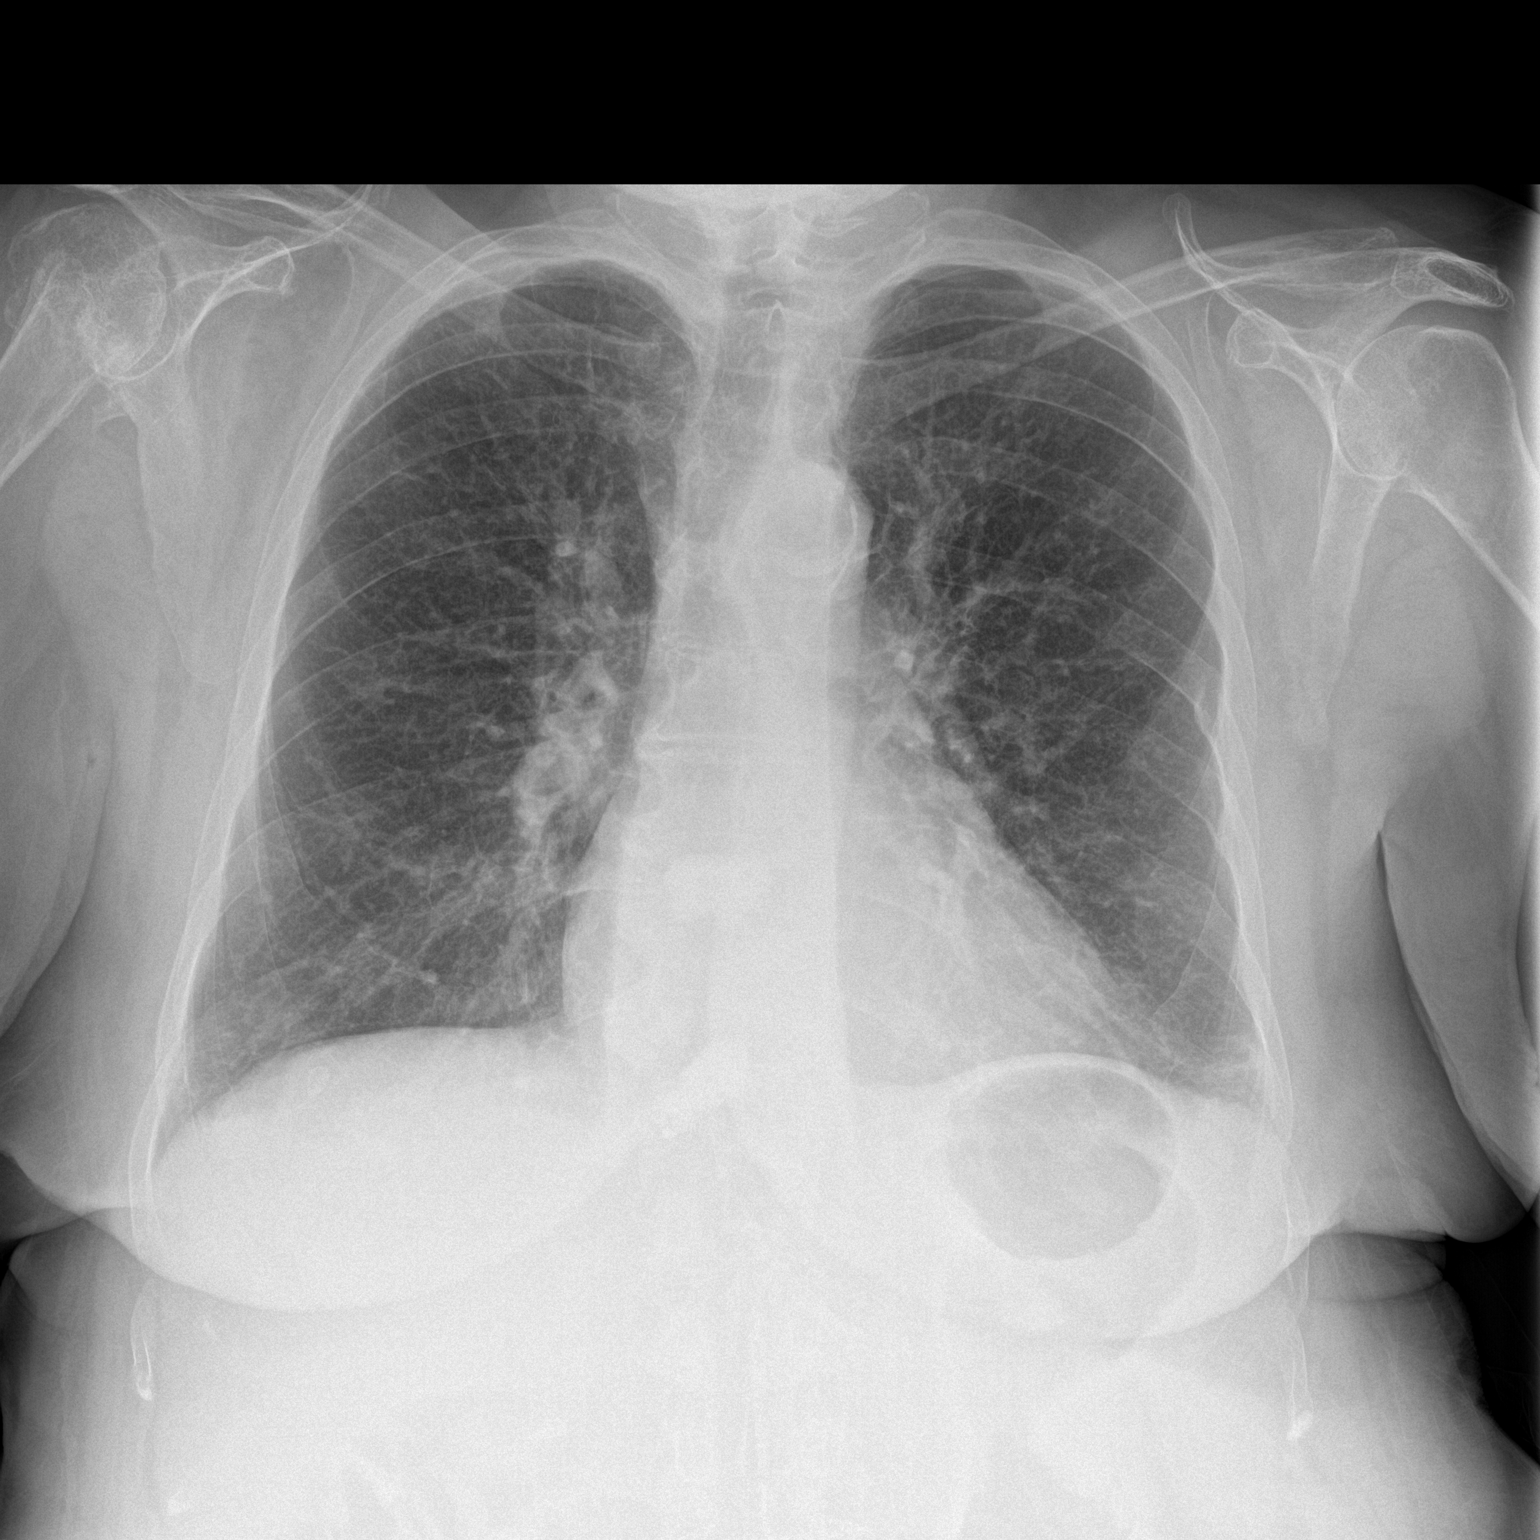
[im 2/2]
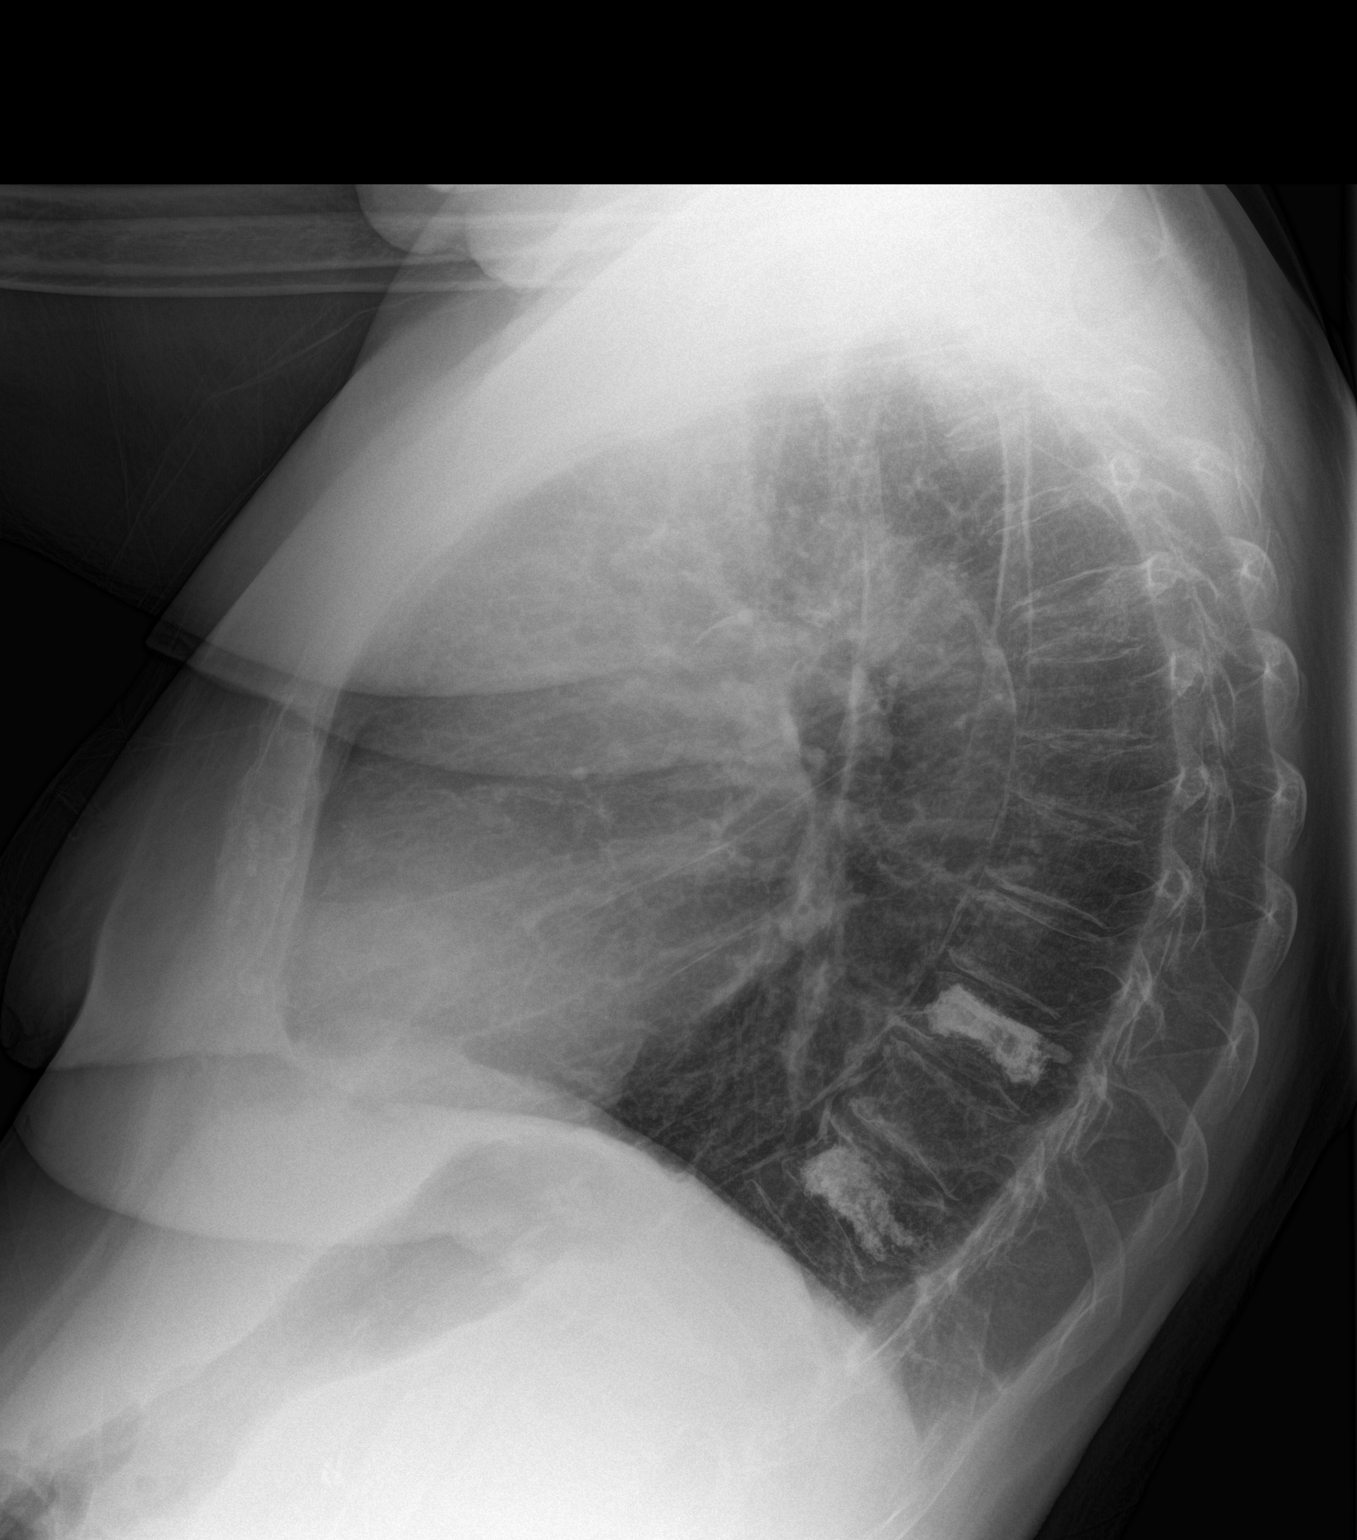

[2 of 2 positions shown; findings below may reference images not displayed]

FINDINGS: Cardiac silhouette is normal in size. No mediastinal or hilar masses
or evidence of adenopathy. There is mild diffuse prominence of the
bronchovascular and interstitial markings, stable. Minor reticular
scarring is noted in the left lateral lung base. No lung
consolidation or edema. Lungs are hyperexpanded. No pleural effusion
or pneumothorax.

There multiple vertebral compression fractures, to previously
treated with vertebroplasty. Old healed left-sided rib fractures.
Old proximal right humerus fracture. Bones are diffusely
demineralized.
IMPRESSION: 1. No acute cardiopulmonary disease. Lungs hyperexpanded with
prominent bronchovascular markings. COPD is suspected.

## 2021-01-01 DEATH — deceased
